# Patient Record
Sex: Male | Born: 1960 | Race: Black or African American | Hispanic: No | Smoking: Never smoker
Health system: Southern US, Community
[De-identification: ages and names within clinical notes are randomized; demographics above are authoritative.]

## PROBLEM LIST (undated history)

## (undated) DIAGNOSIS — M1990 Unspecified osteoarthritis, unspecified site: Secondary | ICD-10-CM

## (undated) DIAGNOSIS — G709 Myoneural disorder, unspecified: Secondary | ICD-10-CM

## (undated) DIAGNOSIS — E785 Hyperlipidemia, unspecified: Secondary | ICD-10-CM

## (undated) DIAGNOSIS — I10 Essential (primary) hypertension: Secondary | ICD-10-CM

## (undated) DIAGNOSIS — J45909 Unspecified asthma, uncomplicated: Secondary | ICD-10-CM

## (undated) DIAGNOSIS — R519 Headache, unspecified: Secondary | ICD-10-CM

## (undated) DIAGNOSIS — G4730 Sleep apnea, unspecified: Secondary | ICD-10-CM

## (undated) HISTORY — PX: KNEE ARTHROPLASTY: SHX992

## (undated) HISTORY — PX: ANKLE SURGERY: SHX546

## (undated) HISTORY — PX: OTHER SURGICAL HISTORY: SHX169

---

## 1999-08-20 ENCOUNTER — Emergency Department (HOSPITAL_COMMUNITY): Admission: EM | Admit: 1999-08-20 | Discharge: 1999-08-20 | Payer: Self-pay

## 2001-03-12 ENCOUNTER — Emergency Department (HOSPITAL_COMMUNITY): Admission: EM | Admit: 2001-03-12 | Discharge: 2001-03-12 | Payer: Self-pay | Admitting: Emergency Medicine

## 2008-11-19 ENCOUNTER — Emergency Department (HOSPITAL_COMMUNITY): Admission: EM | Admit: 2008-11-19 | Discharge: 2008-11-19 | Payer: Self-pay | Admitting: Emergency Medicine

## 2010-03-10 ENCOUNTER — Emergency Department (HOSPITAL_COMMUNITY): Admission: EM | Admit: 2010-03-10 | Discharge: 2010-03-10 | Payer: Self-pay | Admitting: Family Medicine

## 2010-07-21 ENCOUNTER — Ambulatory Visit: Payer: Self-pay | Admitting: Internal Medicine

## 2010-10-20 ENCOUNTER — Ambulatory Visit: Payer: Self-pay | Admitting: Internal Medicine

## 2011-02-20 ENCOUNTER — Encounter (HOSPITAL_BASED_OUTPATIENT_CLINIC_OR_DEPARTMENT_OTHER)
Admission: RE | Admit: 2011-02-20 | Discharge: 2011-02-20 | Disposition: A | Discharge status: Home / Self Care | Payer: Managed Care, Other (non HMO) | Source: Ambulatory Visit | Attending: Orthopaedic Surgery | Admitting: Orthopaedic Surgery

## 2011-02-20 LAB — BASIC METABOLIC PANEL
BUN: 13 mg/dL (ref 6–23)
Creatinine, Ser: 1.01 mg/dL (ref 0.50–1.35)
GFR calc Af Amer: >90 mL/min (ref >90)
GFR calc non Af Amer: 85 mL/min — ABNORMAL LOW (ref >90)

## 2011-02-21 ENCOUNTER — Ambulatory Visit (HOSPITAL_BASED_OUTPATIENT_CLINIC_OR_DEPARTMENT_OTHER)
Admission: RE | Admit: 2011-02-21 | Discharge: 2011-02-21 | Disposition: A | Discharge status: Home / Self Care | Payer: Managed Care, Other (non HMO) | Source: Ambulatory Visit | Attending: Orthopaedic Surgery | Admitting: Orthopaedic Surgery

## 2011-02-21 DIAGNOSIS — M9420 Chondromalacia, unspecified site: Secondary | ICD-10-CM | POA: Insufficient documentation

## 2011-02-21 DIAGNOSIS — M23329 Other meniscus derangements, posterior horn of medial meniscus, unspecified knee: Secondary | ICD-10-CM | POA: Insufficient documentation

## 2011-02-21 DIAGNOSIS — E669 Obesity, unspecified: Secondary | ICD-10-CM | POA: Insufficient documentation

## 2011-02-21 DIAGNOSIS — Z01812 Encounter for preprocedural laboratory examination: Secondary | ICD-10-CM | POA: Insufficient documentation

## 2011-02-21 DIAGNOSIS — Z01810 Encounter for preprocedural cardiovascular examination: Secondary | ICD-10-CM | POA: Insufficient documentation

## 2011-03-16 NOTE — Op Note (Signed)
  NAMENAYSHAWN, MESTA NO.:  [id number]  MEDICAL RECORD NO.:  [id number]  LOCATION:                                 FACILITY:  PHYSICIAN:  Lubertha Basque. Jerl Santos, M.D.     DATE OF BIRTH:  DATE OF PROCEDURE:  02/21/2011 DATE OF DISCHARGE:                              OPERATIVE REPORT   PREOPERATIVE DIAGNOSES: 1. Right knee torn medial meniscus tear. 2. Right knee chondromalacia patella.  POSTOPERATIVE DIAGNOSES: 1. Right knee torn medial meniscus tear. 2. Right knee chondromalacia patella.  PROCEDURES: 1. Right knee partial medial meniscectomy. 2. Abrasion chondroplasty, patellofemoral.  ANESTHESIA:  General.  ATTENDING SURGEON:  Lubertha Basque. Jerl Santos, MD  ASSISTANT:  Lindwood Qua, PA   INDICATIONS FOR PROCEDURE:  The patient is a 50 year old man with several months of right knee pain.  This [place].  He has persisted with difficulty despite oral anti- inflammatories, injection, and rest.  By MRI scan, he has a medial meniscus tear and some chondromalacia patella.  He has pain which limits his ability to rest and walk and he is offered an arthroscopy.  Informed operative consent was obtained after discussion of possible complications including reaction to anesthesia and infection.  SUMMARY/FINDINGS/PROCEDURE:  Under general anesthesia, an arthroscopy of the right knee was performed.  The suprapatellar pouch was benign while the patellofemoral joint exhibited grade 3 and 4 change through the intertrochlear groove with large flaps of articular cartilage which were loose and some emanating loose bodies.  A thorough chondroplasty was done along with abrasion to bleeding bone in some small areas.  The loose bodies were removed.  In the medial compartment, he had a posterior horn medial meniscus tear consistent with his scan and about 10% partial medial meniscectomy was done.  There were no degenerative changes here.  The  ACL appeared intact and the lateral compartment completely benign.  DESCRIPTION OF PROCEDURE:  The patient was taken to the operating suite where general anesthetic was applied without difficulty.  He was positioned supine and prepped and draped in normal sterile fashion. After administration of IV Kefzol, an arthroscopy of the right knee was performed through total of 2 portals.  Findings were as noted above and procedure consisted of the partial medial meniscectomy with basket and shaver back to stable tissues.  This was followed by the abrasion chondroplasty of patellofemoral as outlined above.  The knee was thoroughly irrigated followed by placement of Marcaine with epinephrine and morphine.  Adaptic was placed over the portals followed by dry gauze and loose Ace wrap.  Estimated blood loss and intraoperative fluids can be obtained from anesthesia records.  DISPOSITION:  The patient was extubated in the operating room and taken to the recovery room in stable condition where he is to go home the same- day and follow up in the office closely.  I will contact him by phone tonight.     Lubertha Basque Jerl Santos, M.D.     PGD/MEDQ  D:  02/21/2011  T:  02/21/2011  Job:  [id number]  Electronically Signed by Marcene Corning M.D. on 03/16/2011 01:44:38 PM

## 2012-07-19 ENCOUNTER — Emergency Department (HOSPITAL_COMMUNITY): Payer: BC Managed Care – PPO

## 2012-07-19 ENCOUNTER — Emergency Department (HOSPITAL_COMMUNITY)
Admission: EM | Admit: 2012-07-19 | Discharge: 2012-07-20 | Disposition: A | Discharge status: Home / Self Care | Payer: BC Managed Care – PPO | Attending: Emergency Medicine | Admitting: Emergency Medicine

## 2012-07-19 ENCOUNTER — Encounter (HOSPITAL_COMMUNITY): Payer: Self-pay | Admitting: *Deleted

## 2012-07-19 DIAGNOSIS — G510 Bell's palsy: Secondary | ICD-10-CM

## 2012-07-19 DIAGNOSIS — I10 Essential (primary) hypertension: Secondary | ICD-10-CM | POA: Insufficient documentation

## 2012-07-19 DIAGNOSIS — Z79899 Other long term (current) drug therapy: Secondary | ICD-10-CM | POA: Insufficient documentation

## 2012-07-19 DIAGNOSIS — R209 Unspecified disturbances of skin sensation: Secondary | ICD-10-CM | POA: Insufficient documentation

## 2012-07-19 HISTORY — DX: Essential (primary) hypertension: I10

## 2012-07-19 LAB — CBC
HCT: 44.5 % (ref 39.0–52.0)
Hemoglobin: 16.4 g/dL (ref 13.0–17.0)
MCH: 30 pg (ref 26.0–34.0)
MCHC: 36.9 g/dL — ABNORMAL HIGH (ref 30.0–36.0)

## 2012-07-19 LAB — APTT: aPTT: 37 seconds (ref 24–37)

## 2012-07-19 LAB — DIFFERENTIAL
Basophils Relative: 1 % (ref 0–1)
Lymphs Abs: 4.8 10*3/uL — ABNORMAL HIGH (ref 0.7–4.0)
Monocytes Absolute: 1 10*3/uL (ref 0.1–1.0)
Monocytes Relative: 9 % (ref 3–12)
Neutro Abs: 5.2 10*3/uL (ref 1.7–7.7)

## 2012-07-19 LAB — COMPREHENSIVE METABOLIC PANEL
Albumin: 3.8 g/dL (ref 3.5–5.2)
BUN: 14 mg/dL (ref 6–23)
Chloride: 101 mEq/L (ref 96–112)
Creatinine, Ser: 1.23 mg/dL (ref 0.50–1.35)
GFR calc Af Amer: 77 mL/min — ABNORMAL LOW (ref >90)
GFR calc non Af Amer: 66 mL/min — ABNORMAL LOW (ref >90)
Glucose, Bld: 134 mg/dL — ABNORMAL HIGH (ref 70–99)
Total Bilirubin: 0.2 mg/dL — ABNORMAL LOW (ref 0.3–1.2)

## 2012-07-19 LAB — GLUCOSE, CAPILLARY: Glucose-Capillary: 126 mg/dL — ABNORMAL HIGH (ref 70–99)

## 2012-07-19 LAB — TROPONIN I: Troponin I: <0.3 ng/mL (ref <0.30)

## 2012-07-19 MED ORDER — PREDNISONE 50 MG PO TABS: 50.0000 mg | ORAL_TABLET | Freq: Every day | ORAL | Status: DC

## 2012-07-19 MED ORDER — ACYCLOVIR 400 MG PO TABS: 400.0000 mg | ORAL_TABLET | Freq: Four times a day (QID) | ORAL | Status: DC

## 2012-07-19 MED ORDER — ARTIFICIAL TEARS OP OINT: TOPICAL_OINTMENT | OPHTHALMIC | Status: DC | PRN

## 2012-07-19 NOTE — ED Provider Notes (Signed)
MSE was initiated and I personally evaluated the patient and placed orders (if any) at  10:43 PM on July 19, 2012.  The patient appears stable so that the remainder of the MSE may be completed by another provider.  Symptoms of right sided facial droop began at noon today.  Exam most c/w Bell's palsy.  Pt is hypertensive on vitals.    Ethelda Chick, MD 07/19/12 [phone]

## 2012-07-19 NOTE — ED Notes (Signed)
Patient transported back from CT 

## 2012-07-19 NOTE — ED Provider Notes (Addendum)
History  This chart was scribed for Loren Racer, MD by Bennett Scrape, ED Scribe. This patient was seen in room WA13/WA13 and the patient's care was started at 11:03 PM.  CSN: [id number]  Arrival date & time 07/19/12  2234   First MD Initiated Contact with Patient 07/19/12 2303      Chief Complaint  Patient presents with  . Facial Droop     The history is provided by the patient. No language interpreter was used.   Peter Zamora is a 52 y.o. male who presents to the Emergency Department complaining of approximately 11 hours of sudden onset, non-changing, constant right sided facial droop with associated numbness along the right jaw that he noticed at work today. He reports that he noticed the numbness while eating and went home to take a shower when he noticed the facial droop. He states that he wanted to "wait the symptoms out" but became concerned when they did not improve after several hours. He denies having prior episodes of similar symptoms. He denies drooling, trouble speaking and extremity weakness and numbness as associated symptoms. He has a h/o HTN for which he takes daily medications for.   Past Medical History  Diagnosis Date  . Hypertension     History reviewed. No pertinent past surgical history.  History reviewed. No pertinent family history.  History  Substance Use Topics  . Smoking status: Not on file  . Smokeless tobacco: Not on file  . Alcohol Use: Not on file      Review of Systems  Constitutional: Negative for fever and chills.  HENT: Negative for drooling and trouble swallowing.   Gastrointestinal: Negative for nausea and vomiting.  Neurological: Positive for facial asymmetry. Negative for speech difficulty.  All other systems reviewed and are negative.    Allergies  Review of patient's allergies indicates no known allergies.  Home Medications   Current Outpatient Rx  Name  Route  Sig  Dispense  Refill  . etodolac (LODINE) 400 MG  tablet   Oral   Take 400 mg by mouth 2 (two) times daily.         Marland Kitchen losartan-hydrochlorothiazide (HYZAAR) 100-25 MG per tablet   Oral   Take 1 tablet by mouth daily.         . metroNIDAZOLE (FLAGYL) 500 MG tablet   Oral   Take 500 mg by mouth 3 (three) times daily. For 7 days only. Stop date:07/20/12         . acyclovir (ZOVIRAX) 400 MG tablet   Oral   Take 1 tablet (400 mg total) by mouth 4 (four) times daily.   40 tablet   0   . artificial tears (LACRILUBE) OINT ophthalmic ointment   Ophthalmic   Apply to eye every 4 (four) hours as needed.   1 Tube   0   . predniSONE (DELTASONE) 50 MG tablet   Oral   Take 1 tablet (50 mg total) by mouth daily.   5 tablet   0     Triage Vitals: BP 180/101  Pulse 85  Temp(Src) 98.3 F (36.8 C) (Oral)  Resp 16  Ht 6' (1.829 m)  Wt 243 lb (110.224 kg)  BMI 32.95 kg/m2  SpO2 92%  Physical Exam  Nursing note and vitals reviewed. Constitutional: He is oriented to person, place, and time. He appears well-developed and well-nourished. No distress.  HENT:  Head: Normocephalic and atraumatic.  Eyes: Conjunctivae and EOM are normal. Pupils are equal, round, and  reactive to light.  Neck: Neck supple. No tracheal deviation present.  Cardiovascular: Normal rate and regular rhythm.   Pulmonary/Chest: Effort normal and breath sounds normal. No respiratory distress.  Abdominal: Soft. There is no tenderness.  Musculoskeletal: Normal range of motion.  Neurological: He is alert and oriented to person, place, and time.  Right frontalis muscle weakness, right sided facial droop, normal grip strength, 5/5 strength in the lower extremities, normal finger to nose, sensation is intact throughout  Skin: Skin is warm and dry.  Psychiatric: He has a normal mood and affect. His behavior is normal.    ED Course  Procedures (including critical care time)  DIAGNOSTIC STUDIES: Oxygen Saturation is 92% on room air, adequate by my interpretation.     COORDINATION OF CARE: 11:10 PM- Informed pt that I believe that his symptoms represent Bell's Palsy and advised pt that the symptoms are temporary and should improve in a few days. Will provide a neurology referral for pt to f/u. Stated that I would be back to discuss further treatment plan once all of the lab and radiology reports come back. Pt is agreeable to this plan.  11:51 PM -Informed pt of radiology and lab work results. Discussed discharge plan with pt and pt agreed to plan. Also advised pt to follow up with neurology as needed and pt agreed.  Labs Reviewed  CBC - Abnormal; Notable for the following:    WBC 11.4 (*)    MCHC 36.9 (*)    All other components within normal limits  DIFFERENTIAL - Abnormal; Notable for the following:    Lymphs Abs 4.8 (*)    All other components within normal limits  COMPREHENSIVE METABOLIC PANEL - Abnormal; Notable for the following:    Potassium 3.4 (*)    Glucose, Bld 134 (*)    Total Bilirubin 0.2 (*)    GFR calc non Af Amer 66 (*)    GFR calc Af Amer 77 (*)    All other components within normal limits  GLUCOSE, CAPILLARY - Abnormal; Notable for the following:    Glucose-Capillary 126 (*)    All other components within normal limits  PROTIME-INR  APTT  TROPONIN I   Ct Head (brain) Wo Contrast  07/19/2012  *RADIOLOGY REPORT*  Clinical Data: 52 year old male with right facial droop. Hypertensive.  CT HEAD WITHOUT CONTRAST  Technique:  Contiguous axial images were obtained from the base of the skull through the vertex without contrast.  Comparison: None.  Findings: Visualized paranasal sinuses and mastoids are clear.  No acute osseous abnormality identified.  Visualized orbit soft tissues are within normal limits.  Visualized scalp soft tissues are within normal limits.  Mild calcified atherosclerosis at the skull base.  Cerebral volume is within normal limits for age.  No midline shift, ventriculomegaly, mass effect, evidence of mass lesion,  intracranial hemorrhage or evidence of cortically based acute infarction.  Gray-white matter differentiation is within normal limits throughout the brain.  No suspicious intracranial vascular hyperdensity.  IMPRESSION: Normal noncontrast CT appearance of the brain.   Original Report Authenticated By: Erskine Speed, M.D.      1. Bell's palsy       MDM  I personally performed the services described in this documentation, which was scribed in my presence. The recorded information has been reviewed and is accurate.      Loren Racer, MD 08/04/12 1742  Loren Racer, MD 08/04/12 [phone]

## 2012-07-19 NOTE — ED Notes (Signed)
Patient is alert and oriented x3.  He states he was at work today and started having right facial droop. He waiting till he go home to see if it would go away and it did not.  His wife drove him to ED to be seen

## 2012-07-19 NOTE — ED Notes (Signed)
Patient transported to CT 

## 2012-07-21 ENCOUNTER — Telehealth (HOSPITAL_COMMUNITY): Payer: Self-pay | Admitting: Emergency Medicine

## 2013-06-28 IMAGING — CT CT HEAD W/O CM
2 series · 17 of 30 positions shown, 20 images · non-contrast
Comparison: None.

CLINICAL DATA: 51-year-old male with right facial droop.
Hypertensive.

CT HEAD WITHOUT CONTRAST
TECHNIQUE: Contiguous axial images were obtained from the base of
the skull through the vertex without contrast.

[Series 2: head w/o · axial · non-contrast · 0.43mm/px · z∈[+1175,+1290]mm · 9 of 29 slices shown, 12 images]
[im 3/29  brain]
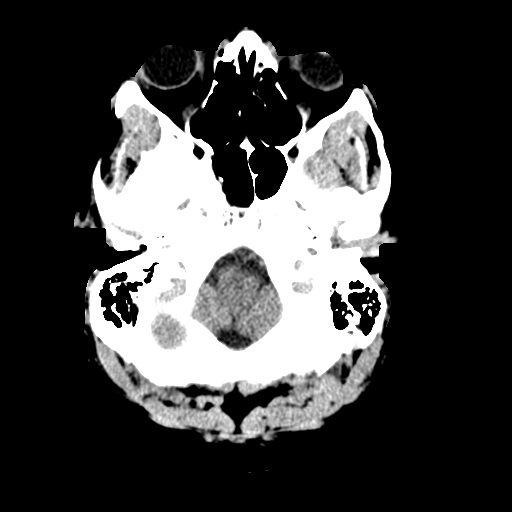
[im 3/29  bone]
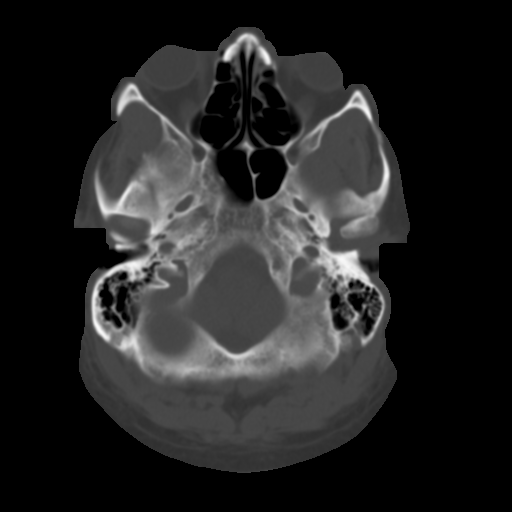
[im 6/29  brain]
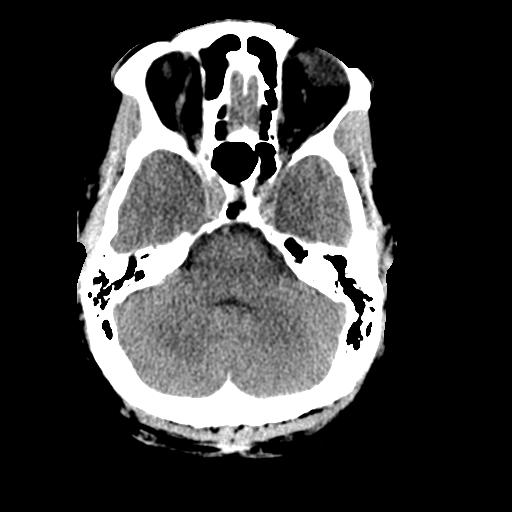
[im 9/29  brain]
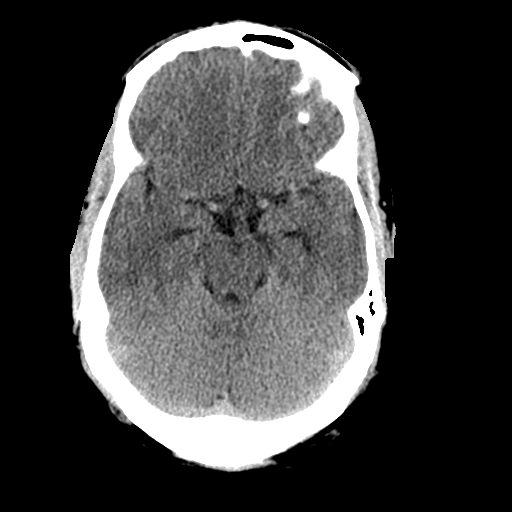
[im 12/29  brain]
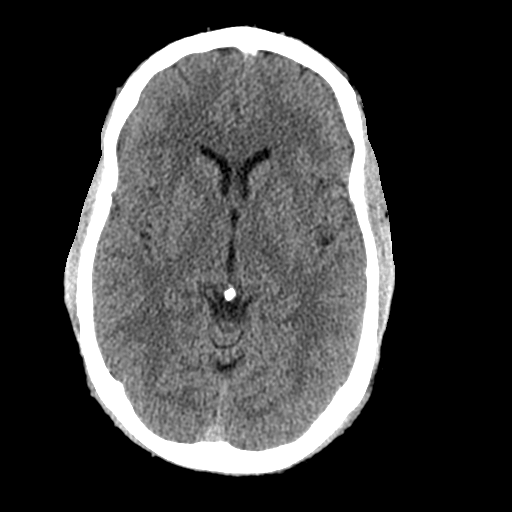
[im 15/29  brain]
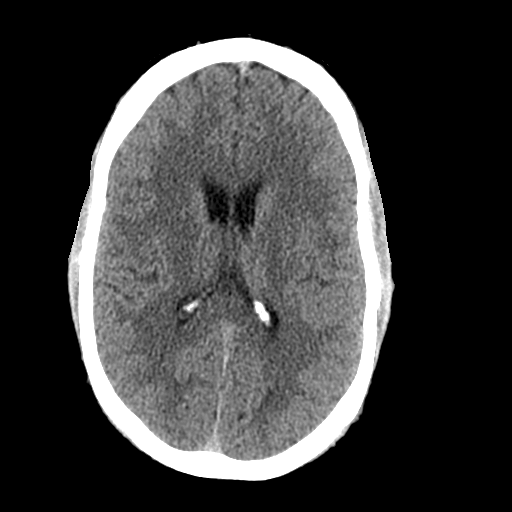
[im 15/29  bone]
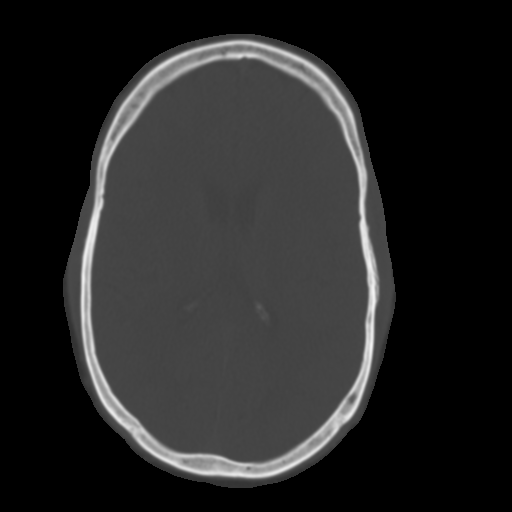
[im 17/29  brain]
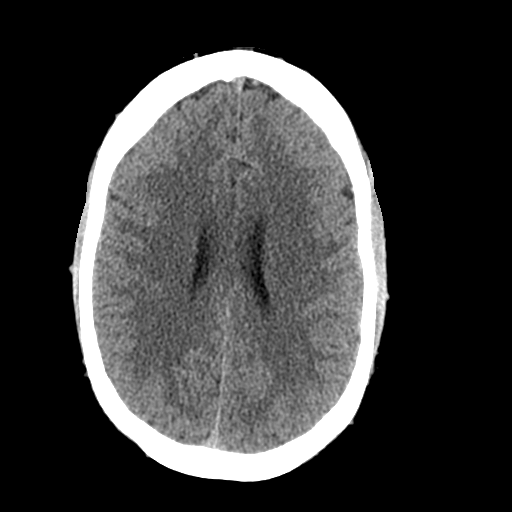
[im 20/29  brain]
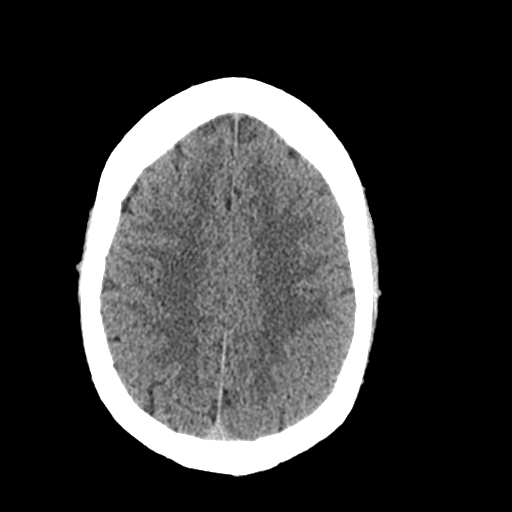
[im 23/29  brain]
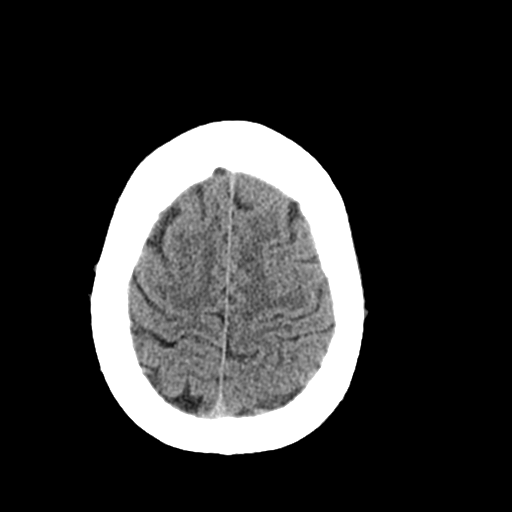
[im 26/29  brain]
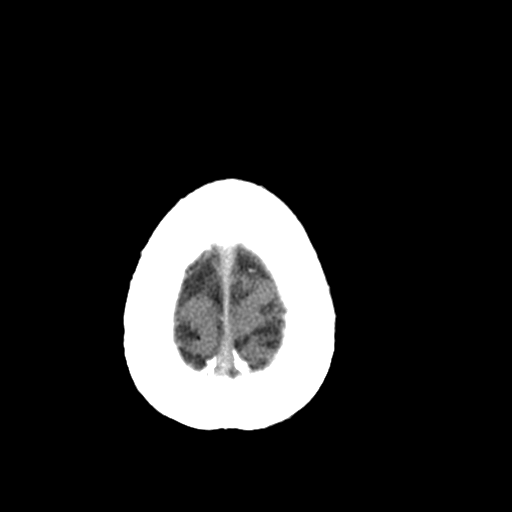
[im 26/29  bone]
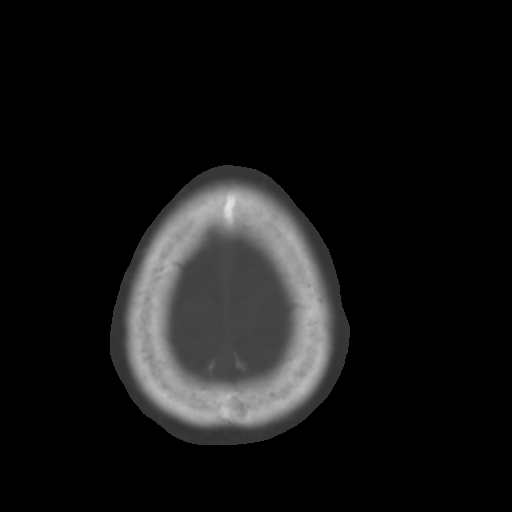

[Series 3: bone windows · axial · 0.43mm/px · z∈[+1180,+1291]mm · 8 of 49 slices shown]
[im 6/49  bone]
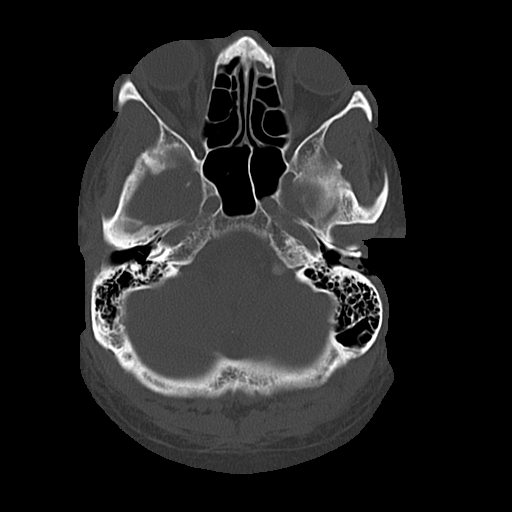
[im 11/49  bone]
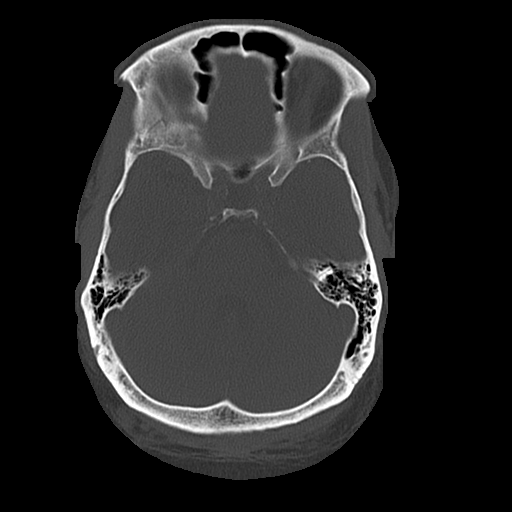
[im 17/49  bone]
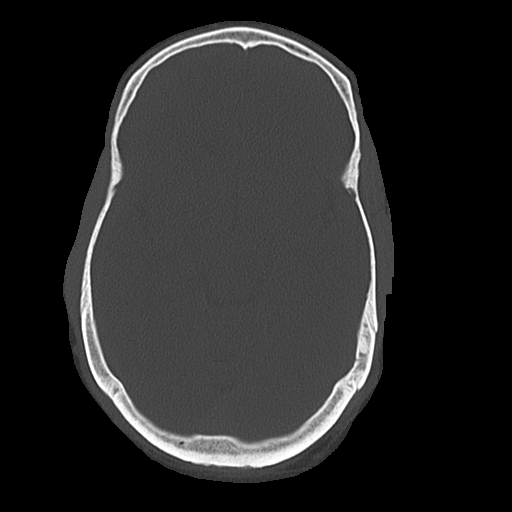
[im 22/49  bone]
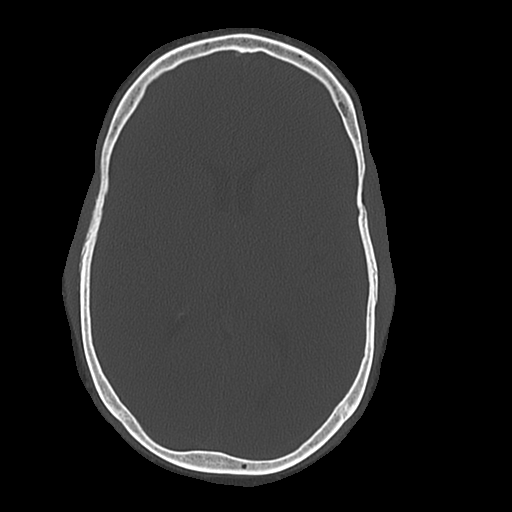
[im 27/49  bone]
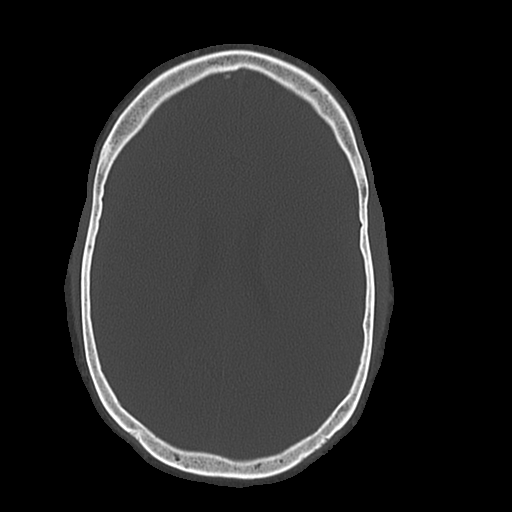
[im 33/49  bone]
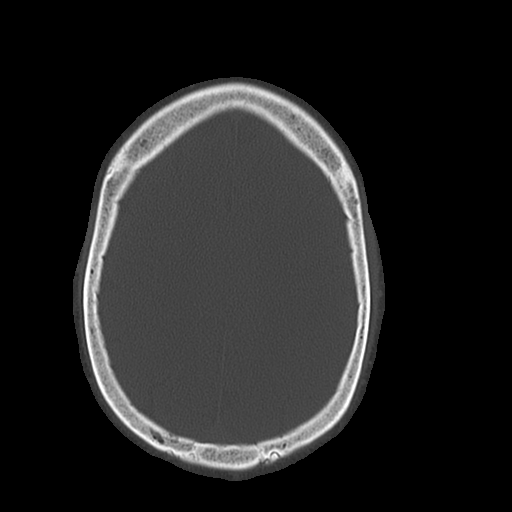
[im 38/49  bone]
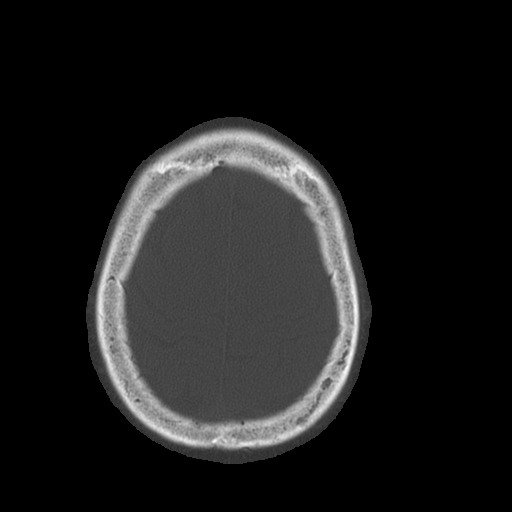
[im 43/49  bone]
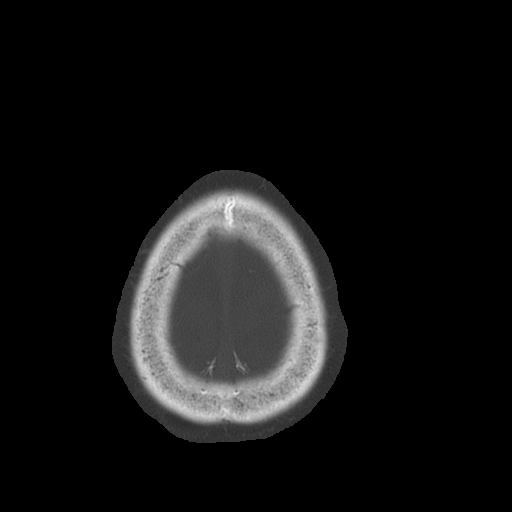

[17 of 30 positions shown; findings below may reference images not displayed]

FINDINGS: Visualized paranasal sinuses and mastoids are clear.  No
acute osseous abnormality identified.  Visualized orbit soft
tissues are within normal limits.  Visualized scalp soft tissues
are within normal limits.

Mild calcified atherosclerosis at the skull base.  Cerebral volume
is within normal limits for age.  No midline shift,
ventriculomegaly, mass effect, evidence of mass lesion,
intracranial hemorrhage or evidence of cortically based acute
infarction.  Gray-white matter differentiation is within normal
limits throughout the brain.  No suspicious intracranial vascular
hyperdensity.
IMPRESSION: Normal noncontrast CT appearance of the brain.

## 2018-01-20 DIAGNOSIS — J449 Chronic obstructive pulmonary disease, unspecified: Secondary | ICD-10-CM

## 2018-01-20 HISTORY — DX: Chronic obstructive pulmonary disease, unspecified: J44.9

## 2020-07-06 HISTORY — PX: HERNIA REPAIR: SHX51

## 2021-05-17 ENCOUNTER — Other Ambulatory Visit: Payer: Self-pay

## 2021-05-17 ENCOUNTER — Inpatient Hospital Stay (HOSPITAL_COMMUNITY)
Admission: EM | Admit: 2021-05-17 | Discharge: 2021-05-19 | DRG: 638 | Disposition: A | Discharge status: Home / Self Care | Payer: No Typology Code available for payment source | Attending: Internal Medicine | Admitting: Internal Medicine

## 2021-05-17 ENCOUNTER — Encounter (HOSPITAL_COMMUNITY): Payer: Self-pay

## 2021-05-17 DIAGNOSIS — Z79899 Other long term (current) drug therapy: Secondary | ICD-10-CM

## 2021-05-17 DIAGNOSIS — E1110 Type 2 diabetes mellitus with ketoacidosis without coma: Secondary | ICD-10-CM | POA: Diagnosis not present

## 2021-05-17 DIAGNOSIS — E669 Obesity, unspecified: Secondary | ICD-10-CM | POA: Diagnosis present

## 2021-05-17 DIAGNOSIS — Z96652 Presence of left artificial knee joint: Secondary | ICD-10-CM | POA: Diagnosis present

## 2021-05-17 DIAGNOSIS — Z7982 Long term (current) use of aspirin: Secondary | ICD-10-CM

## 2021-05-17 DIAGNOSIS — E66811 Obesity, class 1: Secondary | ICD-10-CM | POA: Diagnosis present

## 2021-05-17 DIAGNOSIS — J449 Chronic obstructive pulmonary disease, unspecified: Secondary | ICD-10-CM | POA: Diagnosis present

## 2021-05-17 DIAGNOSIS — R7989 Other specified abnormal findings of blood chemistry: Secondary | ICD-10-CM | POA: Diagnosis present

## 2021-05-17 DIAGNOSIS — E860 Dehydration: Secondary | ICD-10-CM | POA: Diagnosis present

## 2021-05-17 DIAGNOSIS — E876 Hypokalemia: Secondary | ICD-10-CM | POA: Diagnosis present

## 2021-05-17 DIAGNOSIS — E785 Hyperlipidemia, unspecified: Secondary | ICD-10-CM | POA: Diagnosis present

## 2021-05-17 DIAGNOSIS — Z20822 Contact with and (suspected) exposure to covid-19: Secondary | ICD-10-CM | POA: Diagnosis present

## 2021-05-17 DIAGNOSIS — G629 Polyneuropathy, unspecified: Secondary | ICD-10-CM

## 2021-05-17 DIAGNOSIS — F39 Unspecified mood [affective] disorder: Secondary | ICD-10-CM | POA: Diagnosis present

## 2021-05-17 DIAGNOSIS — I10 Essential (primary) hypertension: Secondary | ICD-10-CM | POA: Diagnosis present

## 2021-05-17 DIAGNOSIS — D72829 Elevated white blood cell count, unspecified: Secondary | ICD-10-CM | POA: Diagnosis present

## 2021-05-17 DIAGNOSIS — Z7952 Long term (current) use of systemic steroids: Secondary | ICD-10-CM

## 2021-05-17 DIAGNOSIS — Z8249 Family history of ischemic heart disease and other diseases of the circulatory system: Secondary | ICD-10-CM

## 2021-05-17 DIAGNOSIS — F439 Reaction to severe stress, unspecified: Secondary | ICD-10-CM | POA: Diagnosis present

## 2021-05-17 DIAGNOSIS — Z6831 Body mass index (BMI) 31.0-31.9, adult: Secondary | ICD-10-CM

## 2021-05-17 DIAGNOSIS — N179 Acute kidney failure, unspecified: Secondary | ICD-10-CM | POA: Diagnosis present

## 2021-05-17 DIAGNOSIS — R17 Unspecified jaundice: Secondary | ICD-10-CM | POA: Diagnosis present

## 2021-05-17 DIAGNOSIS — Z833 Family history of diabetes mellitus: Secondary | ICD-10-CM

## 2021-05-17 DIAGNOSIS — E119 Type 2 diabetes mellitus without complications: Secondary | ICD-10-CM

## 2021-05-17 DIAGNOSIS — M79674 Pain in right toe(s): Secondary | ICD-10-CM | POA: Diagnosis present

## 2021-05-17 DIAGNOSIS — E1142 Type 2 diabetes mellitus with diabetic polyneuropathy: Secondary | ICD-10-CM | POA: Diagnosis present

## 2021-05-17 HISTORY — DX: Hyperlipidemia, unspecified: E78.5

## 2021-05-17 LAB — GLUCOSE, CAPILLARY
Glucose-Capillary: 567 mg/dL (ref 70–99)
Glucose-Capillary: 397 mg/dL — ABNORMAL HIGH (ref 70–99)
Glucose-Capillary: 418 mg/dL — ABNORMAL HIGH (ref 70–99)
Glucose-Capillary: 287 mg/dL — ABNORMAL HIGH (ref 70–99)
Glucose-Capillary: 251 mg/dL — ABNORMAL HIGH (ref 70–99)
Glucose-Capillary: 208 mg/dL — ABNORMAL HIGH (ref 70–99)
Glucose-Capillary: 215 mg/dL — ABNORMAL HIGH (ref 70–99)
Glucose-Capillary: 216 mg/dL — ABNORMAL HIGH (ref 70–99)
Glucose-Capillary: 293 mg/dL — ABNORMAL HIGH (ref 70–99)

## 2021-05-17 LAB — COMPREHENSIVE METABOLIC PANEL
ALT: 27 U/L (ref 0–44)
AST: 16 U/L (ref 15–41)
Albumin: 4.2 g/dL (ref 3.5–5.0)
Alkaline Phosphatase: 168 U/L — ABNORMAL HIGH (ref 38–126)
Anion gap: >20 — ABNORMAL HIGH (ref 5–15)
BUN: 33 mg/dL — ABNORMAL HIGH (ref 6–20)
CO2: 17 mmol/L — ABNORMAL LOW (ref 22–32)
Calcium: 9.2 mg/dL (ref 8.9–10.3)
Chloride: 84 mmol/L — ABNORMAL LOW (ref 98–111)
Creatinine, Ser: 2.06 mg/dL — ABNORMAL HIGH (ref 0.61–1.24)
GFR, Estimated: 36 mL/min — ABNORMAL LOW (ref >60)
Glucose, Bld: 787 mg/dL (ref 70–99)
Potassium: 4.7 mmol/L (ref 3.5–5.1)
Sodium: 123 mmol/L — ABNORMAL LOW (ref 135–145)
Total Bilirubin: 1.5 mg/dL — ABNORMAL HIGH (ref 0.3–1.2)
Total Protein: 8.3 g/dL — ABNORMAL HIGH (ref 6.5–8.1)

## 2021-05-17 LAB — URINALYSIS, ROUTINE W REFLEX MICROSCOPIC
Bacteria, UA: NONE SEEN
Bilirubin Urine: NEGATIVE
Glucose, UA: >=500 mg/dL — AB
Ketones, ur: 20 mg/dL — AB
Leukocytes,Ua: NEGATIVE
Nitrite: NEGATIVE
Protein, ur: NEGATIVE mg/dL
Specific Gravity, Urine: 1.023 (ref 1.005–1.030)
pH: 5 (ref 5.0–8.0)

## 2021-05-17 LAB — CBC
HCT: 43.8 % (ref 39.0–52.0)
Hemoglobin: 15.7 g/dL (ref 13.0–17.0)
MCH: 29.8 pg (ref 26.0–34.0)
MCHC: 35.8 g/dL (ref 30.0–36.0)
MCV: 83.1 fL (ref 80.0–100.0)
Platelets: 191 10*3/uL (ref 150–400)
RBC: 5.27 MIL/uL (ref 4.22–5.81)
RDW: 12 % (ref 11.5–15.5)
WBC: 12.7 10*3/uL — ABNORMAL HIGH (ref 4.0–10.5)
nRBC: 0 % (ref 0.0–0.2)

## 2021-05-17 LAB — BLOOD GAS, VENOUS
Acid-base deficit: 8.5 mmol/L — ABNORMAL HIGH (ref 0.0–2.0)
Bicarbonate: 18 mmol/L — ABNORMAL LOW (ref 20.0–28.0)
O2 Saturation: 32.4 %
Patient temperature: 98.6
pCO2, Ven: 41.8 mmHg — ABNORMAL LOW (ref 44.0–60.0)
pH, Ven: 7.257 (ref 7.250–7.430)
pO2, Ven: <31 mmHg — CL (ref 32.0–45.0)

## 2021-05-17 LAB — BASIC METABOLIC PANEL WITH GFR
Anion gap: 18 — ABNORMAL HIGH (ref 5–15)
BUN: 32 mg/dL — ABNORMAL HIGH (ref 6–20)
CO2: 18 mmol/L — ABNORMAL LOW (ref 22–32)
Calcium: 9.4 mg/dL (ref 8.9–10.3)
Chloride: 94 mmol/L — ABNORMAL LOW (ref 98–111)
Creatinine, Ser: 1.79 mg/dL — ABNORMAL HIGH (ref 0.61–1.24)
GFR, Estimated: 43 mL/min — ABNORMAL LOW
Glucose, Bld: 402 mg/dL — ABNORMAL HIGH (ref 70–99)
Potassium: 4.3 mmol/L (ref 3.5–5.1)
Sodium: 130 mmol/L — ABNORMAL LOW (ref 135–145)

## 2021-05-17 LAB — BETA-HYDROXYBUTYRIC ACID
Beta-Hydroxybutyric Acid: 7.59 mmol/L — ABNORMAL HIGH (ref 0.05–0.27)
Beta-Hydroxybutyric Acid: 5.82 mmol/L — ABNORMAL HIGH (ref 0.05–0.27)

## 2021-05-17 LAB — RESP PANEL BY RT-PCR (FLU A&B, COVID) ARPGX2
Influenza A by PCR: NEGATIVE
Influenza B by PCR: NEGATIVE
SARS Coronavirus 2 by RT PCR: NEGATIVE

## 2021-05-17 LAB — CBG MONITORING, ED: Glucose-Capillary: >600 mg/dL (ref 70–99)

## 2021-05-17 LAB — TSH: TSH: 1.545 u[IU]/mL (ref 0.350–4.500)

## 2021-05-17 LAB — MAGNESIUM: Magnesium: 2.4 mg/dL (ref 1.7–2.4)

## 2021-05-17 LAB — MRSA NEXT GEN BY PCR, NASAL: MRSA by PCR Next Gen: NOT DETECTED

## 2021-05-17 MED ORDER — LACTATED RINGERS IV SOLN: INTRAVENOUS | Status: DC

## 2021-05-17 MED ORDER — DEXTROSE IN LACTATED RINGERS 5 % IV SOLN: INTRAVENOUS | Status: DC

## 2021-05-17 MED ORDER — ACETAMINOPHEN 650 MG RE SUPP: 650.0000 mg | Freq: Four times a day (QID) | RECTAL | Status: DC | PRN

## 2021-05-17 MED ORDER — INSULIN REGULAR(HUMAN) IN NACL 100-0.9 UT/100ML-% IV SOLN: INTRAVENOUS | Status: DC

## 2021-05-17 MED ORDER — ACETAMINOPHEN 325 MG PO TABS: 650.0000 mg | ORAL_TABLET | Freq: Four times a day (QID) | ORAL | Status: DC | PRN

## 2021-05-17 MED ORDER — HYDROMORPHONE HCL 1 MG/ML IJ SOLN: 1.0000 mg | Freq: Once | INTRAMUSCULAR | Status: DC

## 2021-05-17 MED ORDER — ONDANSETRON HCL 4 MG/2ML IJ SOLN
4.0000 mg | Freq: Once | INTRAMUSCULAR | Status: AC
  Administered 2021-05-17: 15:00:00 4 mg via INTRAVENOUS
  Filled 2021-05-17: qty 2

## 2021-05-17 MED ORDER — LACTATED RINGERS IV BOLUS
10.0000 mL/kg | Freq: Once | INTRAVENOUS | Status: AC
  Administered 2021-05-17: 15:00:00 1021 mL via INTRAVENOUS

## 2021-05-17 MED ORDER — DEXTROSE 50 % IV SOLN: 0.0000 mL | INTRAVENOUS | Status: DC | PRN

## 2021-05-17 MED ORDER — POTASSIUM CHLORIDE 10 MEQ/100ML IV SOLN
10.0000 meq | INTRAVENOUS | Status: AC
  Administered 2021-05-17 (×2): 10 meq via INTRAVENOUS
  Filled 2021-05-17 (×2): qty 100

## 2021-05-17 MED ORDER — INSULIN REGULAR(HUMAN) IN NACL 100-0.9 UT/100ML-% IV SOLN
INTRAVENOUS | Status: DC
  Administered 2021-05-17: 16:00:00 13 [IU]/h via INTRAVENOUS
  Administered 2021-05-18: 09:00:00 2.4 [IU]/h via INTRAVENOUS
  Filled 2021-05-17 (×2): qty 100

## 2021-05-17 MED ORDER — CHLORHEXIDINE GLUCONATE CLOTH 2 % EX PADS
6.0000 | MEDICATED_PAD | Freq: Every day | CUTANEOUS | Status: DC
  Administered 2021-05-17: 18:00:00 6 via TOPICAL

## 2021-05-17 MED ORDER — LACTATED RINGERS IV BOLUS
1000.0000 mL | Freq: Once | INTRAVENOUS | Status: AC
  Administered 2021-05-17: 18:00:00 1000 mL via INTRAVENOUS

## 2021-05-17 MED ORDER — ENOXAPARIN SODIUM 60 MG/0.6ML IJ SOSY
50.0000 mg | PREFILLED_SYRINGE | INTRAMUSCULAR | Status: DC
  Administered 2021-05-17 – 2021-05-18 (×2): 50 mg via SUBCUTANEOUS
  Filled 2021-05-17: qty 0.5
  Filled 2021-05-17: qty 0.6
  Filled 2021-05-17: qty 0.5

## 2021-05-17 MED ORDER — METOPROLOL TARTRATE 5 MG/5ML IV SOLN
5.0000 mg | Freq: Three times a day (TID) | INTRAVENOUS | Status: DC
  Administered 2021-05-17 – 2021-05-19 (×5): 5 mg via INTRAVENOUS
  Filled 2021-05-17 (×5): qty 5

## 2021-05-17 MED ORDER — PANTOPRAZOLE SODIUM 40 MG IV SOLR
40.0000 mg | INTRAVENOUS | Status: DC
  Administered 2021-05-17 – 2021-05-18 (×2): 40 mg via INTRAVENOUS
  Filled 2021-05-17 (×2): qty 40

## 2021-05-17 MED ORDER — ONDANSETRON HCL 4 MG/2ML IJ SOLN: 4.0000 mg | Freq: Four times a day (QID) | INTRAMUSCULAR | Status: DC | PRN

## 2021-05-17 MED ORDER — LACTATED RINGERS IV BOLUS
1000.0000 mL | Freq: Once | INTRAVENOUS | Status: AC
Start: 2021-05-17 — End: 2021-05-17
  Administered 2021-05-17: 15:00:00 1000 mL via INTRAVENOUS

## 2021-05-17 MED ORDER — ONDANSETRON HCL 4 MG PO TABS: 4.0000 mg | ORAL_TABLET | Freq: Four times a day (QID) | ORAL | Status: DC | PRN

## 2021-05-17 NOTE — ED Notes (Signed)
PCO2 <31.0. Received from lab.

## 2021-05-17 NOTE — ED Provider Notes (Signed)
Emergency Medicine Provider Triage Evaluation Note  Peter Zamora , a 60 y.o. male  was evaluated in triage.  Pt complains of fatigue for 4 weeks. States nausea and fatigue and decreased appetite. Was seen at [facility] today and told his sodium was low and kidney function had worsened. Has med list w NSAID and ARB.   Review of Systems  Positive: Fatigue  Negative: Fever   Physical Exam  BP (!) 130/91 (BP Location: Left Arm)    Pulse 93    Temp 98.7 F (37.1 C) (Oral)    Resp 16    SpO2 97%  Gen:   Awake, no distress   Resp:  Normal effort MSK:   Moves extremities without difficulty  Other:    Medical Decision Making  Medically screening exam initiated at 1:41 PM.  Appropriate orders placed.  Peter Zamora was informed that the remainder of the evaluation will be completed by another provider, this initial triage assessment does not replace that evaluation, and the importance of remaining in the ED until their evaluation is complete.  Labs ordered. Seems kidney function abn and low sodium.  Non-alcoholic per patient. And no history of kidney disease.   Solon Augusta Lockwood, [place] 05/17/21 1343    Pricilla Loveless, MD 05/17/21 [phone]

## 2021-05-17 NOTE — ED Notes (Signed)
Pt. Blood work was unsuccessful. Nurse aware.

## 2021-05-17 NOTE — H&P (Signed)
History and Physical    Peter Zamora TXM:[id number] DOB: 15-Nov-1960 DOA: 05/17/2021  PCP: Pcp, No  Patient coming from: Home.  I have personally briefly reviewed patient's old medical records in [facility]  Chief Complaint: Abnormal labs.  HPI: Peter Zamora is a 60 y.o. male with medical history significant of COPD, hyperlipidemia, hypertension, prediabetes, class I obesity who is coming to the emergency department after being called by the [facility] clinic asking him to come to the emergency department due to abnormal labs.  He has had over 40 pounds of weight loss in the last 4 weeks.  He has blurred vision, polyuria and polydipsia.  He has felt lightheaded and tired.  No fever, chills, rhinorrhea, sore throat, wheezing, dyspnea or hemoptysis.  No chest pain, palpitations, diaphoresis, PND, orthopnea or pitting edema of the lower extremities.  He has felt nauseous, but no abdominal pain, emesis, diarrhea, constipation, melena or hematochezia.  No flank pain, dysuria, oliguria or hematuria.  ED Course: Initial vital signs were temperature 98.7 F, pulse 93, respiration 16, BP 130/91 mmHg O2 sat 91% on room air.  The patient received LR 2000 mL bolus, ondansetron 4 mg IVPB, KCl 10 mEq x 2 IVPB and was started on an insulin infusion.  Lab work: Urinalysis showed glucosuria more than 500 and ketonuria 20 mg/dL.  There was small hemoglobinuria coronavirus and influenza PCR was negative.  CBC showed a white count of 12.7, hemoglobin of 15.7 g/dL and platelets 482.  BHA was 7.59 mmol/L.  Venous blood gas showed normal pH, PCO2 was 42 and PO2 less than 31 mmHg.  Bicarbonate was 18.0 and acid-base deficit 8.5 mmol/L.  TSH and magnesium were normal.  Sodium was 123, potassium 4.7, chloride 84 and CO2 17 mmol/L with an anion gap of more than 20.  Bilirubin was 1.5, glucose found 187, BUN 33 and creatinine 2.06 mg/dL.  According to the patient he does not have any renal disease.  Total protein was 8.3  mg/dL.  Review of Systems: As per HPI otherwise all other systems reviewed and are negative.  Past Medical History:  Diagnosis Date   Chronic obstructive pulmonary disease (HCC) 01/20/2018   Hyperlipidemia    Hypertension     Past Surgical History:  Procedure Laterality Date   ANKLE SURGERY Right    HERNIA REPAIR  07/06/2020   umbilical hernia repair, done by [facility] in [place]   tka Left     Social History  reports that he has never smoked. He does not have any smokeless tobacco history on file. He reports that he does not currently use alcohol. No history on file for drug use.  Allergies  Allergen Reactions   Diclofenac     Other reaction(s): Itching of eye   Latex Rash and Hives   Melatonin Rash    Family History  Problem Relation Age of Onset   Diabetes Mother    Heart attack Father    Prior to Admission medications   Medication Sig Start Date End Date Taking? Authorizing Provider  albuterol (VENTOLIN HFA) 108 (90 Base) MCG/ACT inhaler INHALE 1 PUFF BY MOUTH FOUR TIMES A DAY AS NEEDED FOR SHORTNESS OF BREATH 09/05/19  Yes [provider]  aspirin 81 MG EC tablet TAKE ONE TABLET BY MOUTH DAILY FOR POOR CIRCULATION 05/17/21  Yes [provider]  Carboxymethylcellulose Sodium 0.25 % SOLN INSTILL 1 DROP IN [facility] EYE 2-4 TIMES DAILY 11/04/20  Yes [provider]  cetirizine (ZYRTEC) 10 MG  tablet Take 1 tablet by mouth daily. 02/01/21  Yes [provider]  chlorthalidone (HYGROTON) 25 MG tablet TAKE ONE-HALF TABLET BY MOUTH EVERY MORNING FOR BLOOD PRESSURE 02/01/21  Yes [provider]  DULoxetine (CYMBALTA) 30 MG capsule Take 1 capsule by mouth 2 (two) times daily. 04/12/21  Yes [provider]  Eyelid Cleansers (AVENOVA) 0.01 % SOLN APPLY 1 SPRAY TO AFFECTED AREA TWICE A DAY AS DIRECTED 05/19/20  Yes [provider]  finasteride (PROSCAR) 5 MG tablet Take 1 tablet by mouth daily. 11/19/20  Yes [provider]   fluticasone (FLONASE) 50 MCG/ACT nasal spray INSTILL 1 SPRAY IN EACH NOSTRIL AT BEDTIME - USE NIGHTLY FOR 2 WEEKS, THEN NIGHTLY IF NEEDED. 02/01/21  Yes [provider]  gabapentin (NEURONTIN) 300 MG capsule TAKE ONE CAPSULE BY MOUTH IN THE MORNING AND EVENING AND TAKE TWO CAPSULES AT BEDTIME 10/05/20  Yes [provider]  guaifenesin (HUMIBID E) 400 MG TABS tablet TAKE ONE TABLET BY MOUTH EVERY 6 HOURS AS NEEDED FOR COUGH 10/22/19  Yes [provider]  lidocaine (LIDODERM) 5 % APPLY 1 PATCH TO SKIN ONCE DAILY (APPLY FOR 12 HOURS, THEN REMOVE FOR 12 HOURS) 02/01/21  Yes [provider]  losartan (COZAAR) 100 MG tablet TAKE ONE TABLET BY MOUTH EVERY DAY FOR HEART 02/01/21  Yes [provider]  lovastatin (MEVACOR) 20 MG tablet TAKE ONE TABLET BY MOUTH ONCE A DAY FOR CHOLESTEROL 02/01/21  Yes [provider]  Magnesium Oxide 420 MG TABS TAKE ONE TABLET BY MOUTH DAILY TAKE TO TREAT LOW MAGNESIUM 02/01/21  Yes [provider]  metoprolol tartrate (LOPRESSOR) 50 MG tablet TAKE ONE AND ONE-HALF TABLETS BY MOUTH TWICE A DAY FOR HEART. INCREASED DOSE. 02/01/21  Yes [provider]  mirtazapine (REMERON) 30 MG tablet Take 1 tablet by mouth at bedtime. 04/12/21  Yes [provider]  Multiple Vitamins-Minerals (MULTIVITAMIN ADULT, MINERALS,) TABS TAKE 1 TABLET BY MOUTH DAILY DIETARY SUPPLEMENT 02/01/21  Yes [provider]  ondansetron (ZOFRAN) 8 MG tablet TAKE ONE-HALF TABLET BY MOUTH THREE TIMES A DAY AS NEEDED FOR NAUSEA 05/17/21  Yes [provider]  oxybutynin (DITROPAN-XL) 5 MG 24 hr tablet Take 1 tablet by mouth daily as needed. 11/19/20  Yes [provider]  Propylene Glycol 0.6 % SOLN INSTILL 1 DROP IN BOTH EYES FOUR TIMES A DAY AS NEEDED 05/19/20  Yes [provider]  QUEtiapine (SEROQUEL) 300 MG tablet TAKE ONE-HALF TABLET BY MOUTH AT BEDTIME FOR MENTAL HEALTH 01/04/21  Yes [provider]   tamsulosin (FLOMAX) 0.4 MG CAPS capsule Take 1 capsule by mouth 2 (two) times daily. 11/19/20  Yes [provider]  acyclovir (ZOVIRAX) 400 MG tablet Take 1 tablet (400 mg total) by mouth 4 (four) times daily. 07/19/12   Julianne Rice, MD  artificial tears (LACRILUBE) OINT ophthalmic ointment Apply to eye every 4 (four) hours as needed. 07/19/12   Julianne Rice, MD  etodolac (LODINE) 400 MG tablet Take 400 mg by mouth 2 (two) times daily.    [provider]  losartan-hydrochlorothiazide (HYZAAR) 100-25 MG per tablet Take 1 tablet by mouth daily.    [provider]  metroNIDAZOLE (FLAGYL) 500 MG tablet Take 500 mg by mouth 3 (three) times daily. For 7 days only. Stop date:07/20/12    [provider]  potassium chloride 20 MEQ/15ML (10%) SOLN TAKE 30MEQ BY MOUTH EVERY MORNING AND TAKE 30 MEQ (22.5 ML) 20MEQ/15ML EVERY AFTERNOON (MUST DILUTE AS INSTRUCTED) *DIFFERENT  DOSE / DIRECTIONS* 02/08/21 05/17/21  [provider]  predniSONE (DELTASONE) 50 MG tablet Take 1 tablet (50 mg total) by mouth daily. 07/19/12   Julianne Rice, MD    Physical Exam: Vitals:   05/17/21 1500 05/17/21 1546 05/17/21 1718 05/17/21 1800  BP: 137/87   132/87  Pulse: 77   81  Resp: 16   (!) 23  Temp:   97.9 F (36.6 C)   TempSrc:   Oral   SpO2: 98%   97%  Weight:  102.1 kg    Height:  5\' 11"  (1.803 m)      Constitutional: Looks acutely ill.  NAD, calm, comfortable Eyes: PERRL, lids and conjunctivae normal ENMT: Mucous membranes and lips are dry.  Posterior pharynx clear of any exudate or lesions. Neck: normal, supple, no masses, no thyromegaly Respiratory: clear to auscultation bilaterally, no wheezing, no crackles. Normal respiratory effort. No accessory muscle use.  Cardiovascular: Regular rate and rhythm, no murmurs / rubs / gallops. No extremity edema. 2+ pedal pulses. No carotid bruits.  Abdomen: Obese, no distention.  Soft, no tenderness, no masses palpated. No  hepatosplenomegaly. Bowel sounds positive.  Musculoskeletal: Mild generalized weakness.  No clubbing / cyanosis. No joint deformity upper and lower extremities. Good ROM, no contractures. Normal muscle tone.  Skin: no rashes, lesions, ulcers on very limited dermatological examination. Neurologic: CN 2-12 grossly intact. Sensation intact, DTR normal. Strength seems to be symmetric. Psychiatric: Normal judgment and insight. Alert and oriented x 3. Normal mood.   Labs on Admission: I have personally reviewed following labs and imaging studies  CBC: Recent Labs  Lab 05/17/21 1421  WBC 12.7*  HGB 15.7  HCT 43.8  MCV 83.1  PLT 99991111    Basic Metabolic Panel: Recent Labs  Lab 05/17/21 1421  NA 123*  K 4.7  CL 84*  CO2 17*  GLUCOSE 787*  BUN 33*  CREATININE 2.06*  CALCIUM 9.2  MG 2.4    GFR: Estimated Creatinine Clearance: 46.4 mL/min (A) (by C-G formula based on SCr of 2.06 mg/dL (H)).  Liver Function Tests: Recent Labs  Lab 05/17/21 1421  AST 16  ALT 27  ALKPHOS 168*  BILITOT 1.5*  PROT 8.3*  ALBUMIN 4.2    Urine analysis:    Component Value Date/Time   COLORURINE STRAW (A) 05/17/2021 1641   APPEARANCEUR CLEAR 05/17/2021 1641   LABSPEC 1.023 05/17/2021 1641   PHURINE 5.0 05/17/2021 1641   GLUCOSEU >=500 (A) 05/17/2021 1641   HGBUR SMALL (A) 05/17/2021 1641   BILIRUBINUR NEGATIVE 05/17/2021 1641   KETONESUR 20 (A) 05/17/2021 1641   PROTEINUR NEGATIVE 05/17/2021 1641   NITRITE NEGATIVE 05/17/2021 1641   LEUKOCYTESUR NEGATIVE 05/17/2021 1641    Radiological Exams on Admission: No results found.  EKG: Independently reviewed.  Vent. rate 79 BPM PR interval 164 ms QRS duration 115 ms QT/QTcB 389/446 ms P-R-T axes 69 57 57 Sinus rhythm Nonspecific intraventricular conduction delay Low voltage, precordial leads  Assessment/Plan Principal Problem:   DKA (diabetic ketoacidosis) (HCC) Observation/stepdown. Keep NPO. Continue IV fluids. Continue insulin  infusion. Antiemetics as needed. Analgesics for abdominal pain as needed. Monitor and replace electrolytes as needed. Consult diabetes coordinator. Check hemoglobin A1c level.  Active Problems:   Elevated serum creatinine Likely AKI. Hold hydrochlorothiazide. Hold losartan. Avoid hypotension. Avoid nephrotoxins. Monitor intake and output. Follow-up renal function electrolytes in AM.    Hypertension Hold diuretic and ARB. Hold etodolac.    Hyperlipidemia Continue statin once tolerating oral intake.  Class 1 obesity Lifestyle modifications. Follow-up closely with PCP.    Chronic obstructive pulmonary disease (HCC) Supplemental oxygen and bronchodilators as needed.    DVT prophylaxis:  Lovenox SQ. Code Status:   Full code. Family Communication:   Disposition Plan:   Patient is from:  Home.  Anticipated DC to:  Home.    Anticipated DC date:  05/18/2021 or 05/19/2021.  Anticipated DC barriers: Clinical status. Consults called:   Admission status:  Observation/stepdown.    Severity of Illness:High severity in the setting of DKA.  Reubin Milan MD [facility]  How to contact the [facility] Attending or Consulting provider [place] or covering provider during after hours [place], for this patient?   Check the care team in [facility] and look for a) attending/consulting [facility] provider listed and b) the [facility] team listed Log into [email or web address] and use [place] universal password to access. If you do not have the password, please contact the hospital operator. Locate the [facility] provider you are looking for under [facility] and page to a number that you can be directly reached. If you still have difficulty reaching the provider, please page the [facility] (Director on Call) for the Hospitalists listed on amion for assistance.  05/17/2021, 6:23 PM   This document was prepared using [employer] and may contain some unintended transcription errors.

## 2021-05-17 NOTE — ED Provider Notes (Signed)
[facility]-EMERGENCY DEPT Provider Note   CSN: [id number] Arrival date & time: 05/17/21  1324     History Chief Complaint  Patient presents with   Abnormal Lab    Peter Zamora is a 60 y.o. male.  HPI 60 year old male presents with fatigue and hyperglycemia/kidney injury.  The patient tells me that his been feeling fatigued for 3-4 weeks.  He lost about 40 pounds over this time unintentionally.  He went to his PCP today at the [place] for a checkup and they checked blood work and noted him to have a creatinine of 2.2, sodium of 120 and a glucose of around 900.  He has no prior history of diabetes.  He denies any pain but he has been feeling nauseated recently.   Past Medical History:  Diagnosis Date   Hypertension     Patient Active Problem List   Diagnosis Date Noted   DKA (diabetic ketoacidosis) (HCC) 05/17/2021    History reviewed. No pertinent surgical history.     History reviewed. No pertinent family history.     Home Medications Prior to Admission medications   Medication Sig Start Date End Date Taking? Authorizing Provider  albuterol (VENTOLIN HFA) 108 (90 Base) MCG/ACT inhaler INHALE 1 PUFF BY MOUTH FOUR TIMES A DAY AS NEEDED FOR SHORTNESS OF BREATH 09/05/19  Yes [provider]  aspirin 81 MG EC tablet TAKE ONE TABLET BY MOUTH DAILY FOR POOR CIRCULATION 05/17/21  Yes [provider]  Carboxymethylcellulose Sodium 0.25 % SOLN INSTILL 1 DROP IN [facility] EYE 2-4 TIMES DAILY 11/04/20  Yes [provider]  cetirizine (ZYRTEC) 10 MG tablet Take 1 tablet by mouth daily. 02/01/21  Yes [provider]  chlorthalidone (HYGROTON) 25 MG tablet TAKE ONE-HALF TABLET BY MOUTH EVERY MORNING FOR BLOOD PRESSURE 02/01/21  Yes [provider]  DULoxetine (CYMBALTA) 30 MG capsule Take 1 capsule by mouth 2 (two) times daily. 04/12/21  Yes [provider]  Eyelid Cleansers (AVENOVA) 0.01 % SOLN APPLY 1 SPRAY TO  AFFECTED AREA TWICE A DAY AS DIRECTED 05/19/20  Yes [provider]  finasteride (PROSCAR) 5 MG tablet Take 1 tablet by mouth daily. 11/19/20  Yes [provider]  fluticasone (FLONASE) 50 MCG/ACT nasal spray INSTILL 1 SPRAY IN EACH NOSTRIL AT BEDTIME - USE NIGHTLY FOR 2 WEEKS, THEN NIGHTLY IF NEEDED. 02/01/21  Yes [provider]  gabapentin (NEURONTIN) 300 MG capsule TAKE ONE CAPSULE BY MOUTH IN THE MORNING AND EVENING AND TAKE TWO CAPSULES AT BEDTIME 10/05/20  Yes [provider]  guaifenesin (HUMIBID E) 400 MG TABS tablet TAKE ONE TABLET BY MOUTH EVERY 6 HOURS AS NEEDED FOR COUGH 10/22/19  Yes [provider]  lidocaine (LIDODERM) 5 % APPLY 1 PATCH TO SKIN ONCE DAILY (APPLY FOR 12 HOURS, THEN REMOVE FOR 12 HOURS) 02/01/21  Yes [provider]  losartan (COZAAR) 100 MG tablet TAKE ONE TABLET BY MOUTH EVERY DAY FOR HEART 02/01/21  Yes [provider]  lovastatin (MEVACOR) 20 MG tablet TAKE ONE TABLET BY MOUTH ONCE A DAY FOR CHOLESTEROL 02/01/21  Yes [provider]  Magnesium Oxide 420 MG TABS TAKE ONE TABLET BY MOUTH DAILY TAKE TO TREAT LOW MAGNESIUM 02/01/21  Yes [provider]  metoprolol tartrate (LOPRESSOR) 50 MG tablet TAKE ONE AND ONE-HALF TABLETS BY MOUTH TWICE A DAY FOR HEART. INCREASED DOSE. 02/01/21  Yes [provider]  mirtazapine (REMERON) 30 MG tablet Take 1 tablet by mouth at bedtime. 04/12/21  Yes [provider]  Multiple Vitamins-Minerals (MULTIVITAMIN ADULT, MINERALS,) TABS TAKE 1 TABLET BY MOUTH DAILY DIETARY SUPPLEMENT 02/01/21  Yes [provider]  ondansetron (ZOFRAN) 8 MG tablet TAKE ONE-HALF TABLET BY MOUTH THREE TIMES A DAY AS NEEDED FOR NAUSEA 05/17/21  Yes [provider]  oxybutynin (DITROPAN-XL) 5 MG 24 hr tablet Take 1 tablet by mouth daily as needed. 11/19/20  Yes [provider]  Propylene Glycol 0.6 % SOLN INSTILL 1 DROP IN BOTH EYES FOUR TIMES A DAY AS  NEEDED 05/19/20  Yes [provider]  QUEtiapine (SEROQUEL) 300 MG tablet TAKE ONE-HALF TABLET BY MOUTH AT BEDTIME FOR MENTAL HEALTH 01/04/21  Yes [provider]  tamsulosin (FLOMAX) 0.4 MG CAPS capsule Take 1 capsule by mouth 2 (two) times daily. 11/19/20  Yes [provider]  acyclovir (ZOVIRAX) 400 MG tablet Take 1 tablet (400 mg total) by mouth 4 (four) times daily. 07/19/12   Loren Racer, MD  artificial tears (LACRILUBE) OINT ophthalmic ointment Apply to eye every 4 (four) hours as needed. 07/19/12   Loren Racer, MD  etodolac (LODINE) 400 MG tablet Take 400 mg by mouth 2 (two) times daily.    [provider]  losartan-hydrochlorothiazide (HYZAAR) 100-25 MG per tablet Take 1 tablet by mouth daily.    [provider]  metroNIDAZOLE (FLAGYL) 500 MG tablet Take 500 mg by mouth 3 (three) times daily. For 7 days only. Stop date:07/20/12    [provider]  potassium chloride 20 MEQ/15ML (10%) SOLN TAKE BY MOUTH EVERY MORNING AND TAKE 30 MEQ (22.5 ML) 20MEQ/15ML EVERY AFTERNOON (MUST DILUTE AS INSTRUCTED) *DIFFERENT DOSE / DIRECTIONS* 02/08/21 05/17/21  [provider]  predniSONE (DELTASONE) 50 MG tablet Take 1 tablet (50 mg total) by mouth daily. 07/19/12   Loren Racer, MD    Allergies    Diclofenac, Latex, and Melatonin  Review of Systems   Review of Systems  Constitutional:  Positive for fatigue and unexpected weight change. Negative for fever.  Respiratory:  Negative for shortness of breath.   Cardiovascular:  Negative for chest pain.  Gastrointestinal:  Positive for nausea. Negative for abdominal pain and vomiting.  Neurological:  Negative for weakness, numbness and headaches.  All other systems reviewed and are negative.  Physical Exam Updated Vital Signs BP 137/87    Pulse 77    Temp 98.7 F (37.1 C) (Oral)    Resp 16    Ht 5\' 11"  (1.803 m)    Wt 102.1 kg    SpO2 98%    BMI 31.38 kg/m   Physical  Exam Vitals and nursing note reviewed.  Constitutional:      General: He is not in acute distress.    Appearance: He is well-developed. He is not ill-appearing or diaphoretic.  HENT:     Head: Normocephalic and atraumatic.     Right Ear: External ear normal.     Left Ear: External ear normal.     Nose: Nose normal.     Mouth/Throat:     Mouth: Mucous membranes are dry.  Eyes:     General:        Right eye: No discharge.        Left eye: No discharge.  Cardiovascular:     Rate and Rhythm: Normal rate and regular rhythm.     Heart sounds: Normal heart sounds.  Pulmonary:     Effort: Pulmonary effort is normal.     Breath sounds: Normal breath sounds.  Abdominal:     General: There is no distension.     Palpations: Abdomen is soft.     Tenderness: There is no abdominal tenderness.  Musculoskeletal:     Cervical back: Neck supple.  Skin:    General: Skin is warm and dry.  Neurological:     Mental Status: He is alert.     Comments: 5/5 strength in all 4 extremities. Grossly normal sensation  Psychiatric:        Mood and Affect: Mood is not anxious.    ED Results / Procedures / Treatments   Labs (all labs ordered are listed, but only abnormal results are displayed) Labs Reviewed  CBC - Abnormal; Notable for the following components:      Result Value   WBC 12.7 (*)    All other components within normal limits  COMPREHENSIVE METABOLIC PANEL - Abnormal; Notable for the following components:   Sodium 123 (*)    Chloride 84 (*)    CO2 17 (*)    Glucose, Bld 787 (*)    BUN 33 (*)    Creatinine, Ser 2.06 (*)    Total Protein 8.3 (*)    Alkaline Phosphatase 168 (*)    Total Bilirubin 1.5 (*)    GFR, Estimated 36 (*)    Anion gap >20 (*)    All other components within normal limits  BLOOD GAS, VENOUS - Abnormal; Notable for the following components:   pCO2, Ven 41.8 (*)    pO2, Ven <31.0 (*)    Bicarbonate 18.0 (*)    Acid-base deficit 8.5 (*)    All other components  within normal limits  BETA-HYDROXYBUTYRIC ACID - Abnormal; Notable for the following components:   Beta-Hydroxybutyric Acid 7.59 (*)    All other components within normal limits  CBG MONITORING, ED - Abnormal; Notable for the following components:   Glucose-Capillary >600 (*)    All other components within normal limits  RESP PANEL BY RT-PCR (FLU A&B, COVID) ARPGX2  TSH  MAGNESIUM  URINALYSIS, ROUTINE W REFLEX MICROSCOPIC  BASIC METABOLIC PANEL  BASIC METABOLIC PANEL  BASIC METABOLIC PANEL  BETA-HYDROXYBUTYRIC ACID  BETA-HYDROXYBUTYRIC ACID  HEMOGLOBIN A1C    EKG EKG Interpretation  Date/Time:  Tuesday May 17 2021 14:19:48 EST Ventricular Rate:  79 PR Interval:  164 QRS Duration: 115 QT Interval:  389 QTC Calculation: 446 R Axis:   57 Text Interpretation: Sinus rhythm Nonspecific intraventricular conduction delay Low voltage, precordial leads Confirmed by Pricilla Loveless [phone]) on 05/17/2021 2:51:58 PM  Radiology No results found.  Procedures .Critical Care Performed by: Pricilla Loveless, MD Authorized by: Pricilla Loveless, MD   Critical care provider statement:    Critical care time (minutes):  35   Critical care time was exclusive of:  Separately billable procedures and treating other patients   Critical care was necessary to treat or prevent imminent or life-threatening deterioration of the following conditions:  Renal failure and endocrine crisis   Critical care was time spent personally by me on the following activities:  Development of treatment plan with patient or surrogate, discussions with consultants, evaluation of patient's response to treatment, examination of patient, ordering and review of laboratory studies, ordering and review of radiographic studies, ordering and performing treatments and interventions, pulse oximetry, re-evaluation of patient's condition and review of old charts   Medications Ordered in ED Medications  lactated ringers bolus 1,021  mL (has no administration in time range)  insulin regular, human (MYXREDLIN) 100 units/ 100 mL  infusion (13 Units/hr Intravenous New Bag/Given 05/17/21 1553)  lactated ringers infusion (has no administration in time range)  potassium chloride 10 mEq in 100 mL IVPB (10 mEq Intravenous New Bag/Given 05/17/21 1550)  lactated ringers infusion ( Intravenous New Bag/Given 05/17/21 1548)  dextrose 5 % in lactated ringers infusion (has no administration in time range)  dextrose 50 % solution 0-50 mL (has no administration in time range)  enoxaparin (LOVENOX) injection 50 mg (has no administration in time range)  acetaminophen (TYLENOL) tablet 650 mg (has no administration in time range)    Or  acetaminophen (TYLENOL) suppository 650 mg (has no administration in time range)  ondansetron (ZOFRAN) tablet 4 mg (has no administration in time range)    Or  ondansetron (ZOFRAN) injection 4 mg (has no administration in time range)  lactated ringers bolus 1,000 mL (1,000 mLs Intravenous New Bag/Given 05/17/21 1508)  ondansetron (ZOFRAN) injection 4 mg (4 mg Intravenous Given 05/17/21 1508)    ED Course  I have reviewed the triage vital signs and the nursing notes.  Pertinent labs & imaging results that were available during my care of the patient were reviewed by me and considered in my medical decision making (see chart for details).    MDM Rules/Calculators/A&P                         Patient presents with new type 2 diabetes/hyperglycemia that has turned into a DKA.  He has evidence of what is likely an acute kidney injury based on report from [place] as well as acidosis consistent with DKA.  He was started on IV fluids and will be given potassium supplementation while started on insulin drip.  He is otherwise stable.  Will admit to the hospitalist service.    Final Clinical Impression(s) / ED Diagnoses Final diagnoses:  Diabetic ketoacidosis without coma associated with type 2 diabetes mellitus (HCC)     Rx / DC Orders ED Discharge Orders     None        Pricilla Loveless, MD 05/17/21 1556

## 2021-05-17 NOTE — ED Triage Notes (Signed)
Pt presents with c/o abnormal labs. [facility]. Pt reports CBG was 905, sodium was 120. Pt denies any pain.

## 2021-05-18 DIAGNOSIS — Z7982 Long term (current) use of aspirin: Secondary | ICD-10-CM | POA: Diagnosis not present

## 2021-05-18 DIAGNOSIS — Z96652 Presence of left artificial knee joint: Secondary | ICD-10-CM | POA: Diagnosis present

## 2021-05-18 DIAGNOSIS — D72829 Elevated white blood cell count, unspecified: Secondary | ICD-10-CM | POA: Diagnosis present

## 2021-05-18 DIAGNOSIS — J449 Chronic obstructive pulmonary disease, unspecified: Secondary | ICD-10-CM | POA: Diagnosis present

## 2021-05-18 DIAGNOSIS — Z833 Family history of diabetes mellitus: Secondary | ICD-10-CM | POA: Diagnosis not present

## 2021-05-18 DIAGNOSIS — Z8249 Family history of ischemic heart disease and other diseases of the circulatory system: Secondary | ICD-10-CM | POA: Diagnosis not present

## 2021-05-18 DIAGNOSIS — R17 Unspecified jaundice: Secondary | ICD-10-CM | POA: Diagnosis present

## 2021-05-18 DIAGNOSIS — Z6831 Body mass index (BMI) 31.0-31.9, adult: Secondary | ICD-10-CM | POA: Diagnosis not present

## 2021-05-18 DIAGNOSIS — E669 Obesity, unspecified: Secondary | ICD-10-CM | POA: Diagnosis present

## 2021-05-18 DIAGNOSIS — F439 Reaction to severe stress, unspecified: Secondary | ICD-10-CM | POA: Diagnosis present

## 2021-05-18 DIAGNOSIS — E1110 Type 2 diabetes mellitus with ketoacidosis without coma: Principal | ICD-10-CM

## 2021-05-18 DIAGNOSIS — Z20822 Contact with and (suspected) exposure to covid-19: Secondary | ICD-10-CM | POA: Diagnosis present

## 2021-05-18 DIAGNOSIS — M79674 Pain in right toe(s): Secondary | ICD-10-CM | POA: Diagnosis present

## 2021-05-18 DIAGNOSIS — E860 Dehydration: Secondary | ICD-10-CM | POA: Diagnosis present

## 2021-05-18 DIAGNOSIS — I10 Essential (primary) hypertension: Secondary | ICD-10-CM | POA: Diagnosis present

## 2021-05-18 DIAGNOSIS — Z7952 Long term (current) use of systemic steroids: Secondary | ICD-10-CM | POA: Diagnosis not present

## 2021-05-18 DIAGNOSIS — E785 Hyperlipidemia, unspecified: Secondary | ICD-10-CM | POA: Diagnosis present

## 2021-05-18 DIAGNOSIS — F39 Unspecified mood [affective] disorder: Secondary | ICD-10-CM | POA: Diagnosis present

## 2021-05-18 DIAGNOSIS — N179 Acute kidney failure, unspecified: Secondary | ICD-10-CM | POA: Diagnosis present

## 2021-05-18 DIAGNOSIS — Z79899 Other long term (current) drug therapy: Secondary | ICD-10-CM | POA: Diagnosis not present

## 2021-05-18 DIAGNOSIS — E1142 Type 2 diabetes mellitus with diabetic polyneuropathy: Secondary | ICD-10-CM | POA: Diagnosis present

## 2021-05-18 DIAGNOSIS — E876 Hypokalemia: Secondary | ICD-10-CM | POA: Diagnosis present

## 2021-05-18 LAB — GLUCOSE, CAPILLARY
Glucose-Capillary: 167 mg/dL — ABNORMAL HIGH (ref 70–99)
Glucose-Capillary: 167 mg/dL — ABNORMAL HIGH (ref 70–99)
Glucose-Capillary: 178 mg/dL — ABNORMAL HIGH (ref 70–99)
Glucose-Capillary: 179 mg/dL — ABNORMAL HIGH (ref 70–99)
Glucose-Capillary: 182 mg/dL — ABNORMAL HIGH (ref 70–99)
Glucose-Capillary: 191 mg/dL — ABNORMAL HIGH (ref 70–99)
Glucose-Capillary: 192 mg/dL — ABNORMAL HIGH (ref 70–99)
Glucose-Capillary: 193 mg/dL — ABNORMAL HIGH (ref 70–99)
Glucose-Capillary: 198 mg/dL — ABNORMAL HIGH (ref 70–99)
Glucose-Capillary: 204 mg/dL — ABNORMAL HIGH (ref 70–99)
Glucose-Capillary: 210 mg/dL — ABNORMAL HIGH (ref 70–99)
Glucose-Capillary: 216 mg/dL — ABNORMAL HIGH (ref 70–99)
Glucose-Capillary: 218 mg/dL — ABNORMAL HIGH (ref 70–99)
Glucose-Capillary: 266 mg/dL — ABNORMAL HIGH (ref 70–99)

## 2021-05-18 LAB — CBC
HCT: 36.3 % — ABNORMAL LOW (ref 39.0–52.0)
Hemoglobin: 13.5 g/dL (ref 13.0–17.0)
MCH: 30.4 pg (ref 26.0–34.0)
MCHC: 37.2 g/dL — ABNORMAL HIGH (ref 30.0–36.0)
MCV: 81.8 fL (ref 80.0–100.0)
Platelets: 147 10*3/uL — ABNORMAL LOW (ref 150–400)
RBC: 4.44 MIL/uL (ref 4.22–5.81)
RDW: 12.1 % (ref 11.5–15.5)
WBC: 10.1 10*3/uL (ref 4.0–10.5)
nRBC: 0 % (ref 0.0–0.2)

## 2021-05-18 LAB — BASIC METABOLIC PANEL
Anion gap: 10 (ref 5–15)
Anion gap: 9 (ref 5–15)
BUN: 22 mg/dL — ABNORMAL HIGH (ref 6–20)
BUN: 28 mg/dL — ABNORMAL HIGH (ref 6–20)
CO2: 23 mmol/L (ref 22–32)
CO2: 23 mmol/L (ref 22–32)
Calcium: 9 mg/dL (ref 8.9–10.3)
Calcium: 9.3 mg/dL (ref 8.9–10.3)
Chloride: 100 mmol/L (ref 98–111)
Chloride: 102 mmol/L (ref 98–111)
Creatinine, Ser: 1.12 mg/dL (ref 0.61–1.24)
Creatinine, Ser: 1.38 mg/dL — ABNORMAL HIGH (ref 0.61–1.24)
GFR, Estimated: 59 mL/min — ABNORMAL LOW (ref >60)
GFR, Estimated: >60 mL/min (ref >60)
Glucose, Bld: 197 mg/dL — ABNORMAL HIGH (ref 70–99)
Glucose, Bld: 199 mg/dL — ABNORMAL HIGH (ref 70–99)
Potassium: 3.2 mmol/L — ABNORMAL LOW (ref 3.5–5.1)
Potassium: 3.2 mmol/L — ABNORMAL LOW (ref 3.5–5.1)
Sodium: 133 mmol/L — ABNORMAL LOW (ref 135–145)
Sodium: 134 mmol/L — ABNORMAL LOW (ref 135–145)

## 2021-05-18 LAB — BETA-HYDROXYBUTYRIC ACID
Beta-Hydroxybutyric Acid: 0.53 mmol/L — ABNORMAL HIGH (ref 0.05–0.27)
Beta-Hydroxybutyric Acid: 1.55 mmol/L — ABNORMAL HIGH (ref 0.05–0.27)

## 2021-05-18 LAB — HIV ANTIBODY (ROUTINE TESTING W REFLEX): HIV Screen 4th Generation wRfx: NONREACTIVE

## 2021-05-18 MED ORDER — INSULIN GLARGINE-YFGN 100 UNIT/ML ~~LOC~~ SOLN
24.0000 [IU] | Freq: Every day | SUBCUTANEOUS | Status: DC
  Administered 2021-05-18 – 2021-05-19 (×2): 24 [IU] via SUBCUTANEOUS
  Filled 2021-05-18 (×2): qty 0.24

## 2021-05-18 MED ORDER — INSULIN ASPART 100 UNIT/ML IJ SOLN
0.0000 [IU] | Freq: Every day | INTRAMUSCULAR | Status: DC
  Administered 2021-05-18: 22:00:00 3 [IU] via SUBCUTANEOUS

## 2021-05-18 MED ORDER — LIVING WELL WITH DIABETES BOOK
Freq: Once | Status: AC
  Filled 2021-05-18: qty 1

## 2021-05-18 MED ORDER — INSULIN STARTER KIT- PEN NEEDLES (ENGLISH)
1.0000 | Freq: Once | Status: AC
  Administered 2021-05-18: 11:00:00 1
  Filled 2021-05-18: qty 1

## 2021-05-18 MED ORDER — LACTATED RINGERS IV SOLN: INTRAVENOUS | Status: DC

## 2021-05-18 MED ORDER — POTASSIUM CHLORIDE CRYS ER 20 MEQ PO TBCR
40.0000 meq | EXTENDED_RELEASE_TABLET | ORAL | Status: AC
  Administered 2021-05-18 (×2): 40 meq via ORAL
  Filled 2021-05-18 (×2): qty 2

## 2021-05-18 MED ORDER — INSULIN ASPART 100 UNIT/ML IJ SOLN
0.0000 [IU] | Freq: Three times a day (TID) | INTRAMUSCULAR | Status: DC
  Administered 2021-05-18: 18:00:00 13 [IU] via SUBCUTANEOUS
  Administered 2021-05-18: 11:00:00 4 [IU] via SUBCUTANEOUS
  Administered 2021-05-19: 12:00:00 11 [IU] via SUBCUTANEOUS
  Administered 2021-05-19: 08:00:00 7 [IU] via SUBCUTANEOUS

## 2021-05-18 MED ORDER — INSULIN ASPART 100 UNIT/ML IJ SOLN
6.0000 [IU] | Freq: Three times a day (TID) | INTRAMUSCULAR | Status: DC
  Administered 2021-05-18 – 2021-05-19 (×4): 6 [IU] via SUBCUTANEOUS

## 2021-05-18 NOTE — Assessment & Plan Note (Signed)
Continue statin. 

## 2021-05-18 NOTE — Assessment & Plan Note (Addendum)
Appears to be new onset diabetes. Hemoglobin A1c ordered. Anion gap resolved. Transition to subcutaneous regimen. 28 units of Lantus plus metformin and farixga.

## 2021-05-18 NOTE — Assessment & Plan Note (Addendum)
Blood pressure stable. Continue ARB Monitor.

## 2021-05-18 NOTE — Progress Notes (Addendum)
Inpatient Diabetes Program Recommendations  AACE/ADA: New Consensus Statement on Inpatient Glycemic Control (2015)  Target Ranges:  Prepandial:   less than 140 mg/dL      Peak postprandial:   less than 180 mg/dL (1-2 hours)      Critically ill patients:  140 - 180 mg/dL   Lab Results  Component Value Date   GLUCAP 167 (H) 05/18/2021    Review of Glycemic Control  Diabetes history: None - New-onset Outpatient Diabetes medications: N/A Current orders for Inpatient glycemic control: IV insulin per [employer] for DKA; transitioned to Semglee 24 units QD, Novolog 0-20 units TID with meals and 0-5 HS + 6 units TID  Started insulin drip on 12/27 at approx 1600. Transitioning to SQ insulin and was given Semglee 24 units @ 1000. New-onset DM. HgbA1C pending Will likely need insulin when discharged.  Inpatient Diabetes Program Recommendations:    Agree with orders.  Will see pt at bedside and begin DM education. Ordered Living Well with Diabetes book and Insulin Starter Kit.  Thank you. Lorenda Peck, RD, LDN, CDE Inpatient Diabetes Coordinator [phone]   Addendum:  Spoke with patient about new diabetes diagnosis, basic pathophysiology of DM Type 2, basic home care, importance of checking CBGs and maintaining good CBG control to prevent long-term and short-term complications. Reviewed glucose and explained that patient will need to continue to  Reviewed signs and symptoms of hyperglycemia and hypoglycemia along with treatment for both. Discussed impact of nutrition, exercise, stress, sickness, and medications on diabetes control. Reviewed Living Well with diabetes booklet and encouraged patient to read through entire book. IAsked patient to check his glucose 4 times per day (before meals and at bedtime) and to keep a log book of glucose readings and insulin taken. Explained how the doctor he follows up with can use the log book to continue to make insulin adjustments if needed.   Patient verbalized understanding of information discussed and he states that he has no further questions at this time related to diabetes.   RNs to provide ongoing basic DM education at bedside with this patient and engage patient to actively check blood glucose and administer insulin injections.  Educated patient on insulin pen use at home. Reviewed contents of insulin flexpen starter kit. Reviewed all steps if insulin pen including attachment of needle, 2-unit air shot, dialing up dose, giving injection, removing needle, disposal of sharps, storage of unused insulin, disposal of insulin etc. Patient able to provide successful return demonstration. Also reviewed troubleshooting with insulin pen. MD to give patient Rxs for insulin pens and insulin pen needles.   Pt requests all diabetes prescriptions, pen needles, glucose meter be sent to [place]. Said [place] will overnight his medications.  Will f/u in am.  Thank you. Lorenda Peck, RD, LDN, CDE Inpatient Diabetes Coordinator [phone]

## 2021-05-18 NOTE — Assessment & Plan Note (Signed)
Body mass index is 31.38 kg/m.  Placing the patient at high risk of poor outcome. Patient actually lost significant weight over last few months secondary to diabetes.

## 2021-05-18 NOTE — Assessment & Plan Note (Addendum)
Secondary to dehydration with DKA. Resume ARB

## 2021-05-18 NOTE — Progress Notes (Signed)
°  Progress Note   Patient: Peter Zamora MCR:[id number] DOB: [birth year] DOA: 05/17/2021     0 DOS: the patient was seen and examined on 05/18/2021   Brief hospital course: No notes on file  Assessment and Plan * DKA (diabetic ketoacidosis) (HCC)- (present on admission) Appears to be new onset diabetes. Hemoglobin A1c ordered. Anion gap resolved. Transition to subcutaneous regimen. Advance diet. Continue with IV fluids. Ideally patient will require 20 units of Lantus but given his severe DKA as well as high requirement for IV insulin I will provide 24 units of Lantus. Continue basal bolus regimen.  Provide education.  Class 1 obesity- (present on admission) Body mass index is 31.38 kg/m.  Placing the patient at high risk of poor outcome. Patient actually lost significant weight over last few months secondary to diabetes.  Hyperlipidemia- (present on admission) Continue statin.  Hypertension- (present on admission) Blood pressure stable. Monitor.  Elevated serum creatinine- (present on admission) Secondary to antihypertensive regimen as well as dehydration with DKA. Monitor.     Subjective: No nausea no vomiting or no fever no chills.  Abdominal pain resolved.  Objective Vital signs were reviewed and unremarkable. General: Appear in mild distress, no Rash; Oral Mucosa Clear, moist. no Abnormal Neck Mass Or lumps, Conjunctiva normal  Cardiovascular: S1 and S2 Present, no Murmur, Respiratory: good respiratory effort, Bilateral Air entry present and CTA, no Crackles, no wheezes Abdomen: Bowel Sound present, Soft and no tenderness Extremities: no Pedal edema Neurology: alert and oriented to time, place, and person affect appropriate. no new focal deficit Gait not checked due to patient safety concerns    Data Reviewed: Anion gap closed.  Hyperglycemia improving.  Family Communication: None at bedside  Disposition: Status is: Inpatient  Remains inpatient  appropriate because: Ongoing treatment for hyperglycemia and ongoing need for IV fluids.         Time spent: 35 minutes  Author: Lynden Oxford 05/18/2021 9:06 PM  For on call review [email or web address].

## 2021-05-19 DIAGNOSIS — E119 Type 2 diabetes mellitus without complications: Secondary | ICD-10-CM

## 2021-05-19 DIAGNOSIS — E876 Hypokalemia: Secondary | ICD-10-CM | POA: Diagnosis present

## 2021-05-19 DIAGNOSIS — D72829 Elevated white blood cell count, unspecified: Secondary | ICD-10-CM | POA: Diagnosis present

## 2021-05-19 DIAGNOSIS — R7989 Other specified abnormal findings of blood chemistry: Secondary | ICD-10-CM | POA: Diagnosis present

## 2021-05-19 DIAGNOSIS — G629 Polyneuropathy, unspecified: Secondary | ICD-10-CM

## 2021-05-19 DIAGNOSIS — F39 Unspecified mood [affective] disorder: Secondary | ICD-10-CM | POA: Diagnosis present

## 2021-05-19 DIAGNOSIS — M79674 Pain in right toe(s): Secondary | ICD-10-CM | POA: Diagnosis present

## 2021-05-19 LAB — BASIC METABOLIC PANEL
Anion gap: 10 (ref 5–15)
BUN: 17 mg/dL (ref 6–20)
CO2: 20 mmol/L — ABNORMAL LOW (ref 22–32)
Calcium: 9 mg/dL (ref 8.9–10.3)
Chloride: 102 mmol/L (ref 98–111)
Creatinine, Ser: 1.25 mg/dL — ABNORMAL HIGH (ref 0.61–1.24)
GFR, Estimated: >60 mL/min (ref >60)
Glucose, Bld: 192 mg/dL — ABNORMAL HIGH (ref 70–99)
Potassium: 3.7 mmol/L (ref 3.5–5.1)
Sodium: 132 mmol/L — ABNORMAL LOW (ref 135–145)

## 2021-05-19 LAB — GLUCOSE, CAPILLARY
Glucose-Capillary: 226 mg/dL — ABNORMAL HIGH (ref 70–99)
Glucose-Capillary: 285 mg/dL — ABNORMAL HIGH (ref 70–99)

## 2021-05-19 LAB — HEMOGLOBIN A1C
Hgb A1c MFr Bld: >15.5 % — ABNORMAL HIGH (ref 4.8–5.6)
Hgb A1c MFr Bld: >15.5 % — ABNORMAL HIGH (ref 4.8–5.6)
Mean Plasma Glucose: >398 mg/dL
Mean Plasma Glucose: >398 mg/dL

## 2021-05-19 LAB — MAGNESIUM: Magnesium: 1.9 mg/dL (ref 1.7–2.4)

## 2021-05-19 MED ORDER — INSULIN GLARGINE 100 UNIT/ML SOLOSTAR PEN: 28.0000 [IU] | PEN_INJECTOR | Freq: Every day | SUBCUTANEOUS | 0 refills | Status: AC

## 2021-05-19 MED ORDER — DAPAGLIFLOZIN PROPANEDIOL 5 MG PO TABS: 5.0000 mg | ORAL_TABLET | Freq: Every day | ORAL | 0 refills | Status: DC

## 2021-05-19 MED ORDER — DULOXETINE HCL 30 MG PO CPEP
30.0000 mg | ORAL_CAPSULE | Freq: Two times a day (BID) | ORAL | Status: DC
  Administered 2021-05-19: 10:00:00 30 mg via ORAL
  Filled 2021-05-19: qty 1

## 2021-05-19 MED ORDER — METOPROLOL TARTRATE 50 MG PO TABS
75.0000 mg | ORAL_TABLET | Freq: Two times a day (BID) | ORAL | Status: DC
  Administered 2021-05-19: 10:00:00 75 mg via ORAL
  Filled 2021-05-19: qty 1

## 2021-05-19 MED ORDER — PRAVASTATIN SODIUM 20 MG PO TABS: 20.0000 mg | ORAL_TABLET | Freq: Every day | ORAL | Status: DC

## 2021-05-19 MED ORDER — LOSARTAN POTASSIUM 50 MG PO TABS
100.0000 mg | ORAL_TABLET | Freq: Every day | ORAL | Status: DC
  Administered 2021-05-19: 10:00:00 100 mg via ORAL
  Filled 2021-05-19: qty 2

## 2021-05-19 MED ORDER — MIRTAZAPINE 15 MG PO TABS: 30.0000 mg | ORAL_TABLET | Freq: Every day | ORAL | Status: DC

## 2021-05-19 MED ORDER — ASPIRIN EC 81 MG PO TBEC
81.0000 mg | DELAYED_RELEASE_TABLET | Freq: Every day | ORAL | Status: DC
  Administered 2021-05-19: 10:00:00 81 mg via ORAL
  Filled 2021-05-19: qty 1

## 2021-05-19 MED ORDER — INSULIN ASPART 100 UNIT/ML IJ SOLN: 8.0000 [IU] | Freq: Three times a day (TID) | INTRAMUSCULAR | Status: DC

## 2021-05-19 MED ORDER — QUETIAPINE FUMARATE 50 MG PO TABS: 150.0000 mg | ORAL_TABLET | Freq: Every day | ORAL | Status: DC

## 2021-05-19 MED ORDER — GABAPENTIN 300 MG PO CAPS
300.0000 mg | ORAL_CAPSULE | Freq: Two times a day (BID) | ORAL | Status: DC
  Administered 2021-05-19: 08:00:00 300 mg via ORAL
  Filled 2021-05-19: qty 1

## 2021-05-19 MED ORDER — INSULIN ASPART 100 UNIT/ML FLEXPEN: 8.0000 [IU] | PEN_INJECTOR | Freq: Three times a day (TID) | SUBCUTANEOUS | 0 refills | Status: DC

## 2021-05-19 MED ORDER — FINASTERIDE 5 MG PO TABS
5.0000 mg | ORAL_TABLET | Freq: Every day | ORAL | Status: DC
  Administered 2021-05-19: 10:00:00 5 mg via ORAL
  Filled 2021-05-19: qty 1

## 2021-05-19 MED ORDER — BLOOD GLUCOSE METER KIT: PACK | 0 refills | Status: DC

## 2021-05-19 MED ORDER — INSULIN GLARGINE-YFGN 100 UNIT/ML ~~LOC~~ SOLN: 28.0000 [IU] | Freq: Every day | SUBCUTANEOUS | Status: DC

## 2021-05-19 MED ORDER — "PEN NEEDLES 3/16"" 31G X 5 MM MISC": 1.0000 | Freq: Every day | 0 refills | Status: DC

## 2021-05-19 MED ORDER — TAMSULOSIN HCL 0.4 MG PO CAPS
0.4000 mg | ORAL_CAPSULE | Freq: Two times a day (BID) | ORAL | Status: DC
  Administered 2021-05-19: 10:00:00 0.4 mg via ORAL
  Filled 2021-05-19: qty 1

## 2021-05-19 MED ORDER — METFORMIN HCL 500 MG PO TABS: 500.0000 mg | ORAL_TABLET | Freq: Every day | ORAL | 0 refills | Status: AC

## 2021-05-19 MED ORDER — GABAPENTIN 300 MG PO CAPS: 600.0000 mg | ORAL_CAPSULE | Freq: Every day | ORAL | Status: DC

## 2021-05-19 NOTE — Progress Notes (Signed)
Inpatient Diabetes Program Recommendations  AACE/ADA: New Consensus Statement on Inpatient Glycemic Control (2015)  Target Ranges:  Prepandial:   less than 140 mg/dL      Peak postprandial:   less than 180 mg/dL (1-2 hours)      Critically ill patients:  140 - 180 mg/dL   Lab Results  Component Value Date   GLUCAP 226 (H) 05/19/2021    Review of Glycemic Control  Current orders for Inpatient glycemic control: Semglee 24 units QD, Novolog 0-20 units TID with meals and 0-5 HS + 6 units TID   Inpatient Diabetes Program Recommendations:    Increase Semglee to 28 units QD Increase Novolog to 8 units TID with meals.  Will need all prescriptions sent to [facility] and per pt they will FedEx overnight to his home. Asked pt about getting insulin at [facility] in case his insulin was held up for any reason and pt refuses to pay anything out of pocket.  Along with insulin, will need:  Insulin pen needles - # [id number] Blood glucose monitoring kit - # [id number]  Will follow-up with pt for any questions or concerns today.  Thank you. Lorenda Peck, RD, LDN, CDE Inpatient Diabetes Coordinator [phone]

## 2021-05-19 NOTE — Progress Notes (Signed)
Assessment unchanged. Pt verbalized understanding of dc instructions including medications and follow up care. Pt has his Living Well with Diabetes and Rhonda, Diabetic Coordinator, made rounds to see pt and ask if he had any questions. All questions answered. Discharged via foot per request to front entrance to meet son accompanied.

## 2021-05-19 NOTE — TOC Initial Note (Signed)
Transition of Care [facility]) - Initial/Assessment Note    Patient Details  Name: Peter Zamora MRN: [id number] Date of Birth: 07-21-1960  Transition of Care [facility]) CM/SW Contact:    Lanier Clam, RN Phone Number: 05/19/2021, 11:35 AM  Clinical Narrative: Patient plans to d/c home. [facility] w[facility] [facility],has [facility],own transport home. No further CM needs.                  Expected Discharge Plan: Home/Self Care Barriers to Discharge: No Barriers Identified   Patient Goals and CMS Choice        Expected Discharge Plan and Services Expected Discharge Plan: Home/Self Care       Living arrangements for the past 2 months: Single Family Home                                      Prior Living Arrangements/Services Living arrangements for the past 2 months: Single Family Home Lives with:: Adult Children   Do you feel safe going back to the place where you live?: Yes               Activities of Daily Living Home Assistive Devices/Equipment: Cane (specify quad or straight), Walker (specify type), Eyeglasses, Blood pressure cuff, CPAP ADL Screening (condition at time of admission) Patient's cognitive ability adequate to safely complete daily activities?: Yes Is the patient deaf or have difficulty hearing?: No Does the patient have difficulty seeing, even when wearing glasses/contacts?: Yes (glaucoma bilateral eyes) Does the patient have difficulty concentrating, remembering, or making decisions?: No Patient able to express need for assistance with ADLs?: Yes Does the patient have difficulty dressing or bathing?: No Independently performs ADLs?: Yes (appropriate for developmental age) Does the patient have difficulty walking or climbing stairs?: No Weakness of Legs: Both Weakness of Arms/Hands: None  Permission Sought/Granted Permission sought to share information with : Case Manager Permission granted to share  information with : Yes, Verbal Permission Granted              Emotional Assessment              Admission diagnosis:  DKA (diabetic ketoacidosis) (HCC) [E11.10] Diabetic ketoacidosis without coma associated with type 2 diabetes mellitus (HCC) [E11.10] Patient Active Problem List   Diagnosis Date Noted   DKA (diabetic ketoacidosis) (HCC) 05/17/2021   Elevated serum creatinine 05/17/2021   Hypertension    Hyperlipidemia    Class 1 obesity    Chronic obstructive pulmonary disease (HCC) 01/20/2018   PCP:  Pcp, No Pharmacy:   [facility] - [place] - [address] [place] [address] Phone: [phone] Fax: [phone]     Social Determinants of Health (SDOH) Interventions    Readmission Risk Interventions No flowsheet data found.

## 2021-05-22 NOTE — Assessment & Plan Note (Signed)
Continue inhaler

## 2021-05-22 NOTE — Assessment & Plan Note (Signed)
Due to hyperglycemia.  

## 2021-05-22 NOTE — Assessment & Plan Note (Signed)
Due to stress reaction. No evidence of acute infection.

## 2021-05-22 NOTE — Discharge Summary (Signed)
Physician Discharge Summary   Patient: Peter Zamora MRN: [id number] DOB: _0   Admit date:     05/17/2021  Discharge date: 05/19/2021  Discharge Physician: Berle Mull   PCP: Pcp, No   Recommendations at discharge: Follow up with PCP in 1 week.  Discharge Diagnoses Principal Problem:   DKA (diabetic ketoacidosis) ([place]) Active Problems:   New onset type 2 diabetes mellitus ([place])   AKI (acute kidney injury) ([place])   Pseudohyponatremia   Hypokalemia   Class 1 obesity   Chronic obstructive pulmonary disease (HCC)   Hyperlipidemia   Hyperbilirubinemia   Leucocytosis   Hypertension   Great toe pain, right   Mood disorder ([place])   Neuropathy  Resolved Problems:   * No resolved hospital problems. [facility] Course   No notes on file  * DKA (diabetic ketoacidosis) ([place])- (present on admission) Appears to be new onset diabetes. Hemoglobin A1c ordered. Anion gap resolved. Transition to subcutaneous regimen. 28 units of Lantus plus metformin and farixga.  New onset type 2 diabetes mellitus ([place]) See DKA  Hypokalemia- (present on admission) Replaced.   Pseudohyponatremia- (present on admission) Due to hyperglycemia   AKI (acute kidney injury) ([place])- (present on admission) Secondary to dehydration with DKA. Resume ARB  Class 1 obesity- (present on admission) Body mass index is 31.38 kg/m.  Placing the patient at high risk of poor outcome. Patient actually lost significant weight over last few months secondary to diabetes.  Chronic obstructive pulmonary disease ([place])- (present on admission) Continue inhaler  Leucocytosis- (present on admission) Due to stress reaction. No evidence of acute infection.  Hyperbilirubinemia- (present on admission) Mild. monitor  Hyperlipidemia- (present on admission) Continue statin.  Great toe pain, right- (present on admission) Pt had mild redness that resolved. Recommended to follow up and establish care with  podiatry.  Hypertension- (present on admission) Blood pressure stable. Continue ARB Monitor.  Mood disorder ([place])- (present on admission) Continue home regimen.   Neuropathy Continue gabapentin.      Consultants: none Procedures performed: none  Disposition: Home Diet recommendation: Carb modified diet  DISCHARGE MEDICATION: Allergies as of 05/19/2021       Reactions   Diclofenac    Other reaction(s): Itching of eye   Latex Rash, Hives   Melatonin Rash        Medication List     STOP taking these medications    chlorthalidone 25 MG tablet Commonly known as: HYGROTON   ibuprofen 800 MG tablet Commonly known as: ADVIL       TAKE these medications    albuterol 108 (90 Base) MCG/ACT inhaler Commonly known as: VENTOLIN HFA Inhale 1-2 puffs into the lungs every 6 (six) hours as needed for wheezing or shortness of breath.   aspirin 81 MG EC tablet Take 81 mg by mouth daily.   blood glucose meter kit and supplies Dispense based on patient and insurance preference. Use up to four times daily as directed. (FOR ICD-10 E10.9, E11.9).   cetirizine 10 MG tablet Commonly known as: ZYRTEC Take 10 mg by mouth daily.   dapagliflozin propanediol 5 MG Tabs tablet Commonly known as: FARXIGA Take 1 tablet (5 mg total) by mouth daily before breakfast.   DULoxetine 30 MG capsule Commonly known as: CYMBALTA Take 30 mg by mouth 2 (two) times daily.   finasteride 5 MG tablet Commonly known as: PROSCAR Take 5 mg by mouth daily.   gabapentin 300 MG capsule Commonly known as: NEURONTIN Take 300-600 mg by mouth  See admin instructions. 311m in the morning and evening 6074mat bedtime   insulin aspart 100 UNIT/ML FlexPen Commonly known as: NOVOLOG Inject 8 Units into the skin 3 (three) times daily with meals.   insulin glargine 100 UNIT/ML Solostar Pen Commonly known as: LANTUS Inject 28 Units into the skin daily.   losartan 100 MG tablet Commonly known as:  COZAAR Take 100 mg by mouth daily.   lovastatin 20 MG tablet Commonly known as: MEVACOR Take 20 mg by mouth daily.   Magnesium Oxide 420 MG Tabs Take 420 mg by mouth daily.   metFORMIN 500 MG tablet Commonly known as: Glucophage Take 1 tablet (500 mg total) by mouth daily with breakfast.   metoprolol tartrate 50 MG tablet Commonly known as: LOPRESSOR Take 75 mg by mouth 2 (two) times daily.   mirtazapine 30 MG tablet Commonly known as: REMERON Take 30 mg by mouth at bedtime.   Multivitamin Adult (Minerals) Tabs Take 1 tablet by mouth daily.   NyQuil Severe Cold/Flu 5-6.25-10-325 MG/15ML Liqd Generic drug: Phenyleph-Doxylamine-DM-APAP Take 15 mLs by mouth 2 (two) times daily as needed (sleep/congestion).   ondansetron 8 MG tablet Commonly known as: ZOFRAN Take 4 mg by mouth 3 (three) times daily as needed for nausea.   oxybutynin 5 MG 24 hr tablet Commonly known as: DITROPAN-XL Take 5 mg by mouth daily as needed (overreactive bladder).   Pen Needles 3/16" 31G X 5 MM Misc 1 Act by Does not apply route 5 (five) times daily.   QUEtiapine 300 MG tablet Commonly known as: SEROQUEL Take 150 mg by mouth at bedtime.   Systane 0.4-0.3 % Soln Generic drug: Polyethyl Glycol-Propyl Glycol Place 1 drop into both eyes 4 (four) times daily as needed (dry eyes).   tamsulosin 0.4 MG Caps capsule Commonly known as: FLOMAX Take 0.4 mg by mouth 2 (two) times daily.        Follow-up Information     PCP. Schedule an appointment as soon as possible for a visit in 1 week(s).   Why: with CBC and BMP and blood glucose log        Endocrinology. Schedule an appointment as soon as possible for a visit in 1 month(s).   Why: for new onset Diabetes        Ophthalmology. Schedule an appointment as soon as possible for a visit in 1 week(s).   Why: Eye check up, [facility]        Podiatry. Schedule an appointment as soon as possible for a visit in 2  week(s).   Why: request PCP as VS for referral.                Discharge Exam: Filed Weights   05/17/21 1546  Weight: 102.1 kg   General: Appear in mild distress, no Rash; Oral Mucosa Clear, moist. no Abnormal Neck Mass Or lumps, Conjunctiva normal  Cardiovascular: S1 and S2 Present, no Murmur, Respiratory: good respiratory effort, Bilateral Air entry present and CTA, no Crackles, no wheezes Abdomen: Bowel Sound present, Soft and no tenderness Extremities: no Pedal edema Neurology: alert and oriented to time, place, and person affect appropriate. no new focal deficit Gait not checked due to patient safety concerns   Condition at discharge: good  The results of significant diagnostics from this hospitalization (including imaging, microbiology, ancillary and laboratory) are listed below for reference.   Imaging Studies: No results found.  Microbiology: Results for orders placed or performed during the hospital  encounter of 05/17/21  Resp Panel by RT-PCR (Flu A&B, Covid) Nasopharyngeal Swab     Status: None   Collection Time: 05/17/21  3:08 PM   Specimen: Nasopharyngeal Swab; Nasopharyngeal(NP) swabs in vial transport medium  Result Value Ref Range Status   SARS Coronavirus 2 by RT PCR NEGATIVE NEGATIVE Final    Comment: (NOTE) SARS-CoV-2 target nucleic acids are NOT DETECTED.  The SARS-CoV-2 RNA is generally detectable in upper respiratory specimens during the acute phase of infection. The lowest concentration of SARS-CoV-2 viral copies this assay can detect is 138 copies/mL. A negative result does not preclude SARS-Cov-2 infection and should not be used as the sole basis for treatment or other patient management decisions. A negative result may occur with  improper specimen collection/handling, submission of specimen other than nasopharyngeal swab, presence of viral mutation(s) within the areas targeted by this assay, and inadequate number of viral copies(<138  copies/mL). A negative result must be combined with clinical observations, patient history, and epidemiological information. The expected result is Negative.  Fact Sheet for Patients:  EntrepreneurPulse.com.au  Fact Sheet for Healthcare Providers:  IncredibleEmployment.be  This test is no t yet approved or cleared by the [place] FDA and  has been authorized for detection and/or diagnosis of SARS-CoV-2 by FDA under an Emergency Use Authorization (EUA). This EUA will remain  in effect (meaning this test can be used) for the duration of the COVID-19 declaration under Section 564(b)(1) of the Act, 21 U.S.C.section 360bbb-3(b)(1), unless the authorization is terminated  or revoked sooner.       Influenza A by PCR NEGATIVE NEGATIVE Final   Influenza B by PCR NEGATIVE NEGATIVE Final    Comment: (NOTE) The Xpert Xpress SARS-CoV-2/FLU/RSV plus assay is intended as an aid in the diagnosis of influenza from Nasopharyngeal swab specimens and should not be used as a sole basis for treatment. Nasal washings and aspirates are unacceptable for Xpert Xpress SARS-CoV-2/FLU/RSV testing.  Fact Sheet for Patients: EntrepreneurPulse.com.au  Fact Sheet for Healthcare Providers: IncredibleEmployment.be  This test is not yet approved or cleared by the [place] FDA and has been authorized for detection and/or diagnosis of SARS-CoV-2 by FDA under an Emergency Use Authorization (EUA). This EUA will remain in effect (meaning this test can be used) for the duration of the COVID-19 declaration under Section 564(b)(1) of the Act, 21 U.S.C. section 360bbb-3(b)(1), unless the authorization is terminated or revoked.  Performed at [facility], [place] [address] [place], [place] [address]   MRSA Next Gen by PCR, Nasal     Status: None   Collection Time: 05/17/21  5:24 PM   Specimen: Nasal Mucosa; Nasal Swab   Result Value Ref Range Status   MRSA by PCR Next Gen NOT DETECTED NOT DETECTED Final    Comment: (NOTE) The GeneXpert MRSA Assay (FDA approved for NASAL specimens only), is one component of a comprehensive MRSA colonization surveillance program. It is not intended to diagnose MRSA infection nor to guide or monitor treatment for MRSA infections. Test performance is not FDA approved in patients less than 52 years old. Performed at [facility], [place] [address], [place], [place] [address]     Labs: CBC: Recent Labs  Lab 05/17/21 1421 05/18/21 0606  WBC 12.7* 10.1  HGB 15.7 13.5  HCT 43.8 36.3*  MCV 83.1 81.8  PLT 191 824*   Basic Metabolic Panel: Recent Labs  Lab 05/17/21 1421 05/17/21 1747 05/17/21 2348 05/18/21 0606 05/19/21 0411  NA 123*  130* 133* 134* 132*  K 4.7 4.3 3.2* 3.2* 3.7  CL 84* 94* 100 102 102  CO2 17* 18* 23 23 20*  GLUCOSE 787* 402* 199* 197* 192*  BUN 33* 32* 28* 22* 17  CREATININE 2.06* 1.79* 1.38* 1.12 1.25*  CALCIUM 9.2 9.4 9.3 9.0 9.0  MG 2.4  --   --   --  1.9   Liver Function Tests: Recent Labs  Lab 05/17/21 1421  AST 16  ALT 27  ALKPHOS 168*  BILITOT 1.5*  PROT 8.3*  ALBUMIN 4.2   CBG: Recent Labs  Lab 05/18/21 1120 05/18/21 1652 05/18/21 2112 05/19/21 0739 05/19/21 1116  GLUCAP 191* 204* 266* 226* 285*    Discharge time spent: greater than 30 minutes.  Signed:  Berle Mull MD.  [facility]

## 2021-05-22 NOTE — Assessment & Plan Note (Signed)
Replaced. °

## 2021-05-22 NOTE — Assessment & Plan Note (Signed)
Mild. monitor

## 2021-05-22 NOTE — Assessment & Plan Note (Signed)
-   Continue home regimen 

## 2021-05-22 NOTE — Assessment & Plan Note (Signed)
See DKA 

## 2021-05-22 NOTE — Assessment & Plan Note (Signed)
Pt had mild redness that resolved. Recommended to follow up and establish care with podiatry.

## 2021-05-22 NOTE — Assessment & Plan Note (Signed)
Continue gabapentin.

## 2022-01-16 ENCOUNTER — Other Ambulatory Visit (HOSPITAL_COMMUNITY): Payer: Self-pay | Admitting: Orthopedic Surgery

## 2022-01-16 DIAGNOSIS — M25522 Pain in left elbow: Secondary | ICD-10-CM

## 2022-01-29 ENCOUNTER — Ambulatory Visit (HOSPITAL_COMMUNITY)
Admission: RE | Admit: 2022-01-29 | Discharge: 2022-01-29 | Disposition: A | Discharge status: Home / Self Care | Payer: No Typology Code available for payment source | Source: Ambulatory Visit | Attending: Orthopedic Surgery | Admitting: Orthopedic Surgery

## 2022-01-29 DIAGNOSIS — M25522 Pain in left elbow: Secondary | ICD-10-CM | POA: Diagnosis present

## 2023-09-26 ENCOUNTER — Other Ambulatory Visit: Payer: Self-pay | Admitting: Urology

## 2023-10-02 NOTE — Patient Instructions (Addendum)
 SURGICAL WAITING ROOM VISITATION  Patients having surgery or a procedure may have no more than 2 support people in the waiting area - these visitors may rotate.    Children under the age of 64 must have an adult with them who is not the patient.  Due to an increase in RSV and influenza rates and associated hospitalizations, children ages 29 and under may not visit patients in [facility] hospitals.  Visitors with respiratory illnesses are discouraged from visiting and should remain at home.  If the patient needs to stay at the hospital during part of their recovery, the visitor guidelines for inpatient rooms apply. Pre-op nurse will coordinate an appropriate time for 1 support person to accompany patient in pre-op.  This support person may not rotate.    Please refer to the [facility] website for the visitor guidelines for Inpatients (after your surgery is over and you are in a regular room).       Your procedure is scheduled on: 10/10/23   Report to [facility] Main Entrance    Report to admitting at 8:15 AM   Call this number if you have problems the morning of surgery [phone]   Do not eat food or drink liquids:After Midnight.but may have sips of water with meds.      Oral Hygiene is also important to reduce your risk of infection.                                    Remember - BRUSH YOUR TEETH THE MORNING OF SURGERY WITH YOUR REGULAR TOOTHPASTE  DENTURES WILL BE REMOVED PRIOR TO SURGERY PLEASE DO NOT APPLY "Poly grip" OR ADHESIVES!!!   Do NOT smoke after Midnight   Stop all vitamins and herbal supplements 7 days before surgery.   Take these medicines the morning of surgery with A SIP OF WATER: cetirizine(zyrtec), chlorthalidone(hygroton), proscar (finasteride ), gabapentin , lovastatin(mevacor), metoprolol , tamsulosin               Do not take Losartan (cozaar ) the morning of surgery  DO NOT TAKE ANY ORAL DIABETIC MEDICATIONS DAY OF YOUR SURGERY Hold Metformin  the  morning of surgery.  Hold Jardiance for 72 hours prior to surgery. Last dose to be 10/06/23                               You may not have any metal on your body including hair pins, jewelry, and body piercing             Do not wear make-up, lotions, powders, perfumes/cologne, or deodorant              Men may shave face and neck.   Do not bring valuables to the hospital.  IS NOT             RESPONSIBLE   FOR VALUABLES.   Contacts, glasses, dentures or bridgework may not be worn into surgery.   Bring small overnight bag day of surgery.   DO NOT BRING YOUR HOME MEDICATIONS TO THE HOSPITAL. PHARMACY WILL DISPENSE MEDICATIONS LISTED ON YOUR MEDICATION LIST TO YOU DURING YOUR ADMISSION IN THE HOSPITAL!    Patients discharged on the day of surgery will not be allowed to drive home.  Someone NEEDS to stay with you for the first 24 hours after anesthesia.   Special Instructions: Bring a copy of  your healthcare power of attorney and living will documents the day of surgery if you haven't scanned them before.              Please read over the following fact sheets you were given: IF YOU HAVE QUESTIONS ABOUT YOUR PRE-OP INSTRUCTIONS PLEASE CALL [phone] Ammon Bales   If you received a COVID test during your pre-op visit  it is requested that you wear a mask when out in public, stay away from anyone that may not be feeling well and notify your surgeon if you develop symptoms. If you test positive for Covid or have been in contact with anyone that has tested positive in the last 10 days please notify you surgeon.    [place] - Preparing for Surgery Before surgery, you can play an important role.  Because skin is not sterile, your skin needs to be as free of germs as possible.  You can reduce the number of germs on your skin by washing with CHG (chlorahexidine gluconate) soap before surgery.  CHG is an antiseptic cleaner which kills germs and bonds with the skin to continue killing  germs even after washing. Please DO NOT use if you have an allergy to CHG or antibacterial soaps.  If your skin becomes reddened/irritated stop using the CHG and inform your nurse when you arrive at Short Stay. Do not shave (including legs and underarms) for at least 48 hours prior to the first CHG shower.  You may shave your face/neck.  Please follow these instructions carefully:  1.  Shower with CHG Soap the night before surgery and the  morning of surgery.  2.  If you choose to wash your hair, wash your hair first as usual with your normal  shampoo.  3.  After you shampoo, rinse your hair and body thoroughly to remove the shampoo.                             4.  Use CHG as you would any other liquid soap.  You can apply chg directly to the skin and wash.  Gently with a scrungie or clean washcloth.  5.  Apply the CHG Soap to your body ONLY FROM THE NECK DOWN.   Do   not use on face/ open                           Wound or open sores. Avoid contact with eyes, ears mouth and   genitals (private parts).                       Wash face,  Genitals (private parts) with your normal soap.             6.  Wash thoroughly, paying special attention to the area where your    surgery  will be performed.  7.  Thoroughly rinse your body with warm water from the neck down.  8.  DO NOT shower/wash with your normal soap after using and rinsing off the CHG Soap.                9.  Pat yourself dry with a clean towel.            10.  Wear clean pajamas.            11.  Place clean sheets on your bed the night of  your first shower and do not  sleep with pets. Day of Surgery : Do not apply any lotions/deodorants the morning of surgery.  Please wear clean clothes to the hospital/surgery center.  FAILURE TO FOLLOW THESE INSTRUCTIONS MAY RESULT IN THE CANCELLATION OF YOUR SURGERY   How to Manage Your Diabetes Before and After Surgery  Why is it important to control my blood sugar before and after  surgery? Improving blood sugar levels before and after surgery helps healing and can limit problems. A way of improving blood sugar control is eating a healthy diet by:  Eating less sugar and carbohydrates  Increasing activity/exercise  Talking with your doctor about reaching your blood sugar goals High blood sugars (greater than 180 mg/dL) can raise your risk of infections and slow your recovery, so you will need to focus on controlling your diabetes during the weeks before surgery. Make sure that the doctor who takes care of your diabetes knows about your planned surgery including the date and location.  How do I manage my blood sugar before surgery? Check your blood sugar at least 4 times a day, starting 2 days before surgery, to make sure that the level is not too high or low. Check your blood sugar the morning of your surgery when you wake up and every 2 hours until you get to the Short Stay unit. If your blood sugar is less than 70 mg/dL, you will need to treat for low blood sugar: Do not take insulin . Treat a low blood sugar (less than 70 mg/dL) with  cup of clear juice (cranberry or apple), 4 glucose tablets, OR glucose gel. Recheck blood sugar in 15 minutes after treatment (to make sure it is greater than 70 mg/dL). If your blood sugar is not greater than 70 mg/dL on recheck, call [phone] for further instructions. Report your blood sugar to the short stay nurse when you get to Short Stay.  If you are admitted to the hospital after surgery: Your blood sugar will be checked by the staff and you will probably be given insulin  after surgery (instead of oral diabetes medicines) to make sure you have good blood sugar levels. The goal for blood sugar control after surgery is 80-180 mg/dL.   WHAT DO I DO ABOUT MY DIABETES MEDICATION?  Do not take oral diabetes medicines (pills) the morning of surgery.         THE MORNING OF SURGERY, take  only half of your regular insulin   dose.  DO NOT TAKE THE FOLLOWING 7 DAYS PRIOR TO SURGERY: Ozempic, Wegovy, Rybelsus (Semaglutide), Byetta (exenatide), Bydureon (exenatide ER), Victoza, Saxenda (liraglutide), or Trulicity (dulaglutide) Mounjaro (Tirzepatide) Adlyxin (Lixisenatide), Polyethylene Glycol Loxenatide.  For patients with insulin  pumps: Contact your diabetes doctor for specific instructions before surgery. Decrease basal rates by 20% at midnight the night before your surgery. Note that if your surgery is planned to be longer than 2 hours, your insulin  pump will be removed and intravenous (IV) insulin  will be started and managed by the nurses and the anesthesiologist. You will be able to restart your insulin  pump once you are awake and able to manage it.  Make sure to bring insulin  pump supplies to the hospital with you in case the  site needs to be changed.  Patient Signature:  Date:   Nurse Signature:  Date:

## 2023-10-02 NOTE — Progress Notes (Signed)
 COVID Vaccine received:  []  No [x]  Yes Date of any COVID positive Test in last 90 days: no PCP - Dr. Lonni Robert at [facility] Cardiologist - At [place]  Chest x-ray -  EKG -  10/04/23 Epic Stress Test -  ECHO -  CARDIAC CATH  Notes requested from [place] on 10/04/23.  Bowel Prep - [x]  No  []   Yes ______  Pacemaker / ICD device [x]  No []  Yes   Spinal Cord Stimulator:[x]  No []  Yes       History of Sleep Apnea? []  No [x]  Yes   CPAP used?- [x]  No []  Yes    Does the patient monitor blood sugar?          []  No [x]  Yes  []  N/A  Patient has: []  NO Hx DM   []  Pre-DM                 []  DM1  [x]   DM2 Does patient have a [employer] or Dexacom? []  No [x]  Yes   Fasting Blood Sugar Ranges- 80-90's Checks Blood Sugar __6___ times a day  GLP1 agonist / usual dose - Ozempic On Sundays Last dose 09/30/23 GLP1 instructions:  SGLT-2 inhibitors / usual dose - no SGLT-2 instructions:   Blood Thinner / Instructions:no Aspirin  Instructions:ASA 81 mg Instructed by MD to stop 7 days PTS.  Comments:   Activity level: Patient is able to climb a flight of stairs without difficulty; [x]  No CP  [x]  No SOB, but would have ___   Patient can  perform ADLs without assistance.   Anesthesia review: HTN, COPD, DM  Patient denies shortness of breath, fever, cough and chest pain at PAT appointment.  Patient verbalized understanding and agreement to the Pre-Surgical Instructions that were given to them at this PAT appointment. Patient was also educated of the need to review these PAT instructions again prior to his/her surgery.I reviewed the appropriate phone numbers to call if they have any and questions or concerns.

## 2023-10-04 ENCOUNTER — Encounter (HOSPITAL_COMMUNITY)
Admission: RE | Admit: 2023-10-04 | Discharge: 2023-10-04 | Disposition: A | Discharge status: Home / Self Care | Source: Ambulatory Visit | Attending: Urology | Admitting: Urology

## 2023-10-04 ENCOUNTER — Other Ambulatory Visit: Payer: Self-pay

## 2023-10-04 ENCOUNTER — Encounter (HOSPITAL_COMMUNITY): Payer: Self-pay

## 2023-10-04 VITALS — BP 135/90 | HR 81 | Temp 98.3°F | Resp 18 | Ht 71.0 in | Wt 240.0 lb

## 2023-10-04 DIAGNOSIS — Z01818 Encounter for other preprocedural examination: Secondary | ICD-10-CM | POA: Insufficient documentation

## 2023-10-04 DIAGNOSIS — Z794 Long term (current) use of insulin: Secondary | ICD-10-CM | POA: Insufficient documentation

## 2023-10-04 DIAGNOSIS — E119 Type 2 diabetes mellitus without complications: Secondary | ICD-10-CM | POA: Insufficient documentation

## 2023-10-04 HISTORY — DX: Sleep apnea, unspecified: G47.30

## 2023-10-04 HISTORY — DX: Headache, unspecified: R51.9

## 2023-10-04 HISTORY — DX: Unspecified asthma, uncomplicated: J45.909

## 2023-10-04 HISTORY — DX: Unspecified osteoarthritis, unspecified site: M19.90

## 2023-10-04 HISTORY — DX: Myoneural disorder, unspecified: G70.9

## 2023-10-04 LAB — CBC
HCT: 48.3 % (ref 39.0–52.0)
Hemoglobin: 17.4 g/dL — ABNORMAL HIGH (ref 13.0–17.0)
MCH: 30.9 pg (ref 26.0–34.0)
MCHC: 36 g/dL (ref 30.0–36.0)
MCV: 85.8 fL (ref 80.0–100.0)
Platelets: 174 10*3/uL (ref 150–400)
RBC: 5.63 MIL/uL (ref 4.22–5.81)
RDW: 12.9 % (ref 11.5–15.5)
WBC: 8.4 10*3/uL (ref 4.0–10.5)
nRBC: 0 % (ref 0.0–0.2)

## 2023-10-04 LAB — HEMOGLOBIN A1C
Hgb A1c MFr Bld: 5.4 % (ref 4.8–5.6)
Mean Plasma Glucose: 108.28 mg/dL

## 2023-10-04 LAB — BASIC METABOLIC PANEL WITH GFR
Anion gap: 8 (ref 5–15)
BUN: 20 mg/dL (ref 8–23)
CO2: 23 mmol/L (ref 22–32)
Calcium: 9.4 mg/dL (ref 8.9–10.3)
Chloride: 106 mmol/L (ref 98–111)
Creatinine, Ser: 1.58 mg/dL — ABNORMAL HIGH (ref 0.61–1.24)
GFR, Estimated: 49 mL/min — ABNORMAL LOW (ref >60)
Glucose, Bld: 104 mg/dL — ABNORMAL HIGH (ref 70–99)
Potassium: 3.4 mmol/L — ABNORMAL LOW (ref 3.5–5.1)
Sodium: 137 mmol/L (ref 135–145)

## 2023-10-09 ENCOUNTER — Encounter (INDEPENDENT_AMBULATORY_CARE_PROVIDER_SITE_OTHER): Payer: Self-pay | Admitting: Otolaryngology

## 2023-10-09 ENCOUNTER — Ambulatory Visit (INDEPENDENT_AMBULATORY_CARE_PROVIDER_SITE_OTHER): Admitting: Otolaryngology

## 2023-10-09 VITALS — BP 122/79 | HR 83

## 2023-10-09 DIAGNOSIS — R0981 Nasal congestion: Secondary | ICD-10-CM | POA: Diagnosis not present

## 2023-10-09 DIAGNOSIS — H6123 Impacted cerumen, bilateral: Secondary | ICD-10-CM | POA: Diagnosis not present

## 2023-10-09 DIAGNOSIS — J310 Chronic rhinitis: Secondary | ICD-10-CM | POA: Diagnosis not present

## 2023-10-09 DIAGNOSIS — R0982 Postnasal drip: Secondary | ICD-10-CM

## 2023-10-09 DIAGNOSIS — J343 Hypertrophy of nasal turbinates: Secondary | ICD-10-CM | POA: Diagnosis not present

## 2023-10-09 MED ORDER — IPRATROPIUM BROMIDE 0.06 % NA SOLN: 2.0000 | Freq: Two times a day (BID) | NASAL | 12 refills | Status: AC | PRN

## 2023-10-09 NOTE — Anesthesia Preprocedure Evaluation (Addendum)
 Anesthesia Evaluation  Patient identified by MRN, date of birth, ID band Patient awake    Reviewed: Allergy & Precautions, NPO status , Patient's Chart, lab work & pertinent test results  History of Anesthesia Complications Negative for: history of anesthetic complications  Airway Mallampati: III  TM Distance: >3 FB Neck ROM: Full   Comment: Left side of the mouth is able to smile, the right side is not. The patient reports a h/o Bell's palsy. Dental  (+) Dental Advisory Given   Pulmonary neg shortness of breath, asthma , sleep apnea (does not tolerate CPAP) , COPD (no recent flares),  COPD inhaler, neg recent URI   Pulmonary exam normal breath sounds clear to auscultation       Cardiovascular hypertension (chlorthalidone, losartan , metoprolol ), Pt. on medications and Pt. on home beta blockers (-) angina (-) Past MI, (-) Cardiac Stents and (-) CABG (-) dysrhythmias  Rhythm:Regular Rate:Normal  HLD   Neuro/Psych  Headaches, neg Seizures  Neuromuscular disease (neuropathy)    GI/Hepatic negative GI ROS, Neg liver ROS,,,  Endo/Other  diabetes, Type 2, Insulin  Dependent, Oral Hypoglycemic Agents    Renal/GU negative Renal ROS   BPH    Musculoskeletal  (+) Arthritis ,    Abdominal  (+) + obese  Peds  Hematology negative hematology ROS (+) Lab Results      Component                Value               Date                      WBC                      8.4                 10/04/2023                HGB                      17.4 (H)            10/04/2023                HCT                      48.3                10/04/2023                MCV                      85.8                10/04/2023                PLT                      174                 10/04/2023              Anesthesia Other Findings Last Jardiance: 10/06/2023  Last Ozempic: 2 weeks ago  Reproductive/Obstetrics                              Anesthesia Physical Anesthesia Plan  ASA: 3  Anesthesia Plan: General   Post-op Pain Management: Tylenol  PO (pre-op)*   Induction: Intravenous  PONV Risk Score and Plan: 1 and Ondansetron , Dexamethasone and Treatment may vary due to age or medical condition  Airway Management Planned: Oral ETT  Additional Equipment:   Intra-op Plan:   Post-operative Plan: Extubation in OR  Informed Consent: I have reviewed the patients History and Physical, chart, labs and discussed the procedure including the risks, benefits and alternatives for the proposed anesthesia with the patient or authorized representative who has indicated his/her understanding and acceptance.     Dental advisory given  Plan Discussed with: CRNA and Anesthesiologist  Anesthesia Plan Comments: (Risks of general anesthesia discussed including, but not limited to, sore throat, hoarse voice, chipped/damaged teeth, injury to vocal cords, nausea and vomiting, allergic reactions, lung infection, heart attack, stroke, and death. All questions answered. )       Anesthesia Quick Evaluation

## 2023-10-10 ENCOUNTER — Ambulatory Visit (HOSPITAL_BASED_OUTPATIENT_CLINIC_OR_DEPARTMENT_OTHER): Admitting: Anesthesiology

## 2023-10-10 ENCOUNTER — Encounter (HOSPITAL_COMMUNITY)
Admission: RE | Disposition: A | Discharge status: Home / Self Care | Payer: Self-pay | Source: Ambulatory Visit | Attending: Urology

## 2023-10-10 ENCOUNTER — Ambulatory Visit (HOSPITAL_COMMUNITY)
Admission: RE | Admit: 2023-10-10 | Discharge: 2023-10-10 | Disposition: A | Discharge status: Home / Self Care | Source: Ambulatory Visit | Attending: Urology | Admitting: Urology

## 2023-10-10 ENCOUNTER — Ambulatory Visit (HOSPITAL_COMMUNITY): Admitting: Anesthesiology

## 2023-10-10 ENCOUNTER — Encounter (HOSPITAL_COMMUNITY): Payer: Self-pay | Admitting: Urology

## 2023-10-10 DIAGNOSIS — Z79899 Other long term (current) drug therapy: Secondary | ICD-10-CM | POA: Insufficient documentation

## 2023-10-10 DIAGNOSIS — J4489 Other specified chronic obstructive pulmonary disease: Secondary | ICD-10-CM | POA: Insufficient documentation

## 2023-10-10 DIAGNOSIS — E1140 Type 2 diabetes mellitus with diabetic neuropathy, unspecified: Secondary | ICD-10-CM | POA: Insufficient documentation

## 2023-10-10 DIAGNOSIS — H6123 Impacted cerumen, bilateral: Secondary | ICD-10-CM | POA: Insufficient documentation

## 2023-10-10 DIAGNOSIS — J343 Hypertrophy of nasal turbinates: Secondary | ICD-10-CM | POA: Insufficient documentation

## 2023-10-10 DIAGNOSIS — R3912 Poor urinary stream: Secondary | ICD-10-CM | POA: Diagnosis not present

## 2023-10-10 DIAGNOSIS — J310 Chronic rhinitis: Secondary | ICD-10-CM | POA: Insufficient documentation

## 2023-10-10 DIAGNOSIS — E785 Hyperlipidemia, unspecified: Secondary | ICD-10-CM | POA: Diagnosis not present

## 2023-10-10 DIAGNOSIS — R3915 Urgency of urination: Secondary | ICD-10-CM | POA: Insufficient documentation

## 2023-10-10 DIAGNOSIS — N401 Enlarged prostate with lower urinary tract symptoms: Secondary | ICD-10-CM | POA: Insufficient documentation

## 2023-10-10 DIAGNOSIS — R351 Nocturia: Secondary | ICD-10-CM | POA: Insufficient documentation

## 2023-10-10 DIAGNOSIS — J449 Chronic obstructive pulmonary disease, unspecified: Secondary | ICD-10-CM | POA: Diagnosis not present

## 2023-10-10 DIAGNOSIS — R0982 Postnasal drip: Secondary | ICD-10-CM | POA: Insufficient documentation

## 2023-10-10 DIAGNOSIS — R350 Frequency of micturition: Secondary | ICD-10-CM | POA: Insufficient documentation

## 2023-10-10 DIAGNOSIS — I10 Essential (primary) hypertension: Secondary | ICD-10-CM | POA: Diagnosis not present

## 2023-10-10 DIAGNOSIS — Z794 Long term (current) use of insulin: Secondary | ICD-10-CM | POA: Diagnosis not present

## 2023-10-10 LAB — GLUCOSE, CAPILLARY
Glucose-Capillary: 111 mg/dL — ABNORMAL HIGH (ref 70–99)
Glucose-Capillary: 158 mg/dL — ABNORMAL HIGH (ref 70–99)

## 2023-10-10 LAB — TYPE AND SCREEN
ABO/RH(D): O POS
Antibody Screen: NEGATIVE

## 2023-10-10 LAB — ABO/RH: ABO/RH(D): O POS

## 2023-10-10 SURGERY — ABLATION, PROSTATE, TRANSURETHRAL, USING WATERJET
Anesthesia: General

## 2023-10-10 MED ORDER — INSULIN ASPART 100 UNIT/ML IJ SOLN: 0.0000 [IU] | INTRAMUSCULAR | Status: DC | PRN

## 2023-10-10 MED ORDER — ALBUTEROL SULFATE HFA 108 (90 BASE) MCG/ACT IN AERS
INHALATION_SPRAY | RESPIRATORY_TRACT | Status: DC | PRN
  Administered 2023-10-10: 4 via RESPIRATORY_TRACT

## 2023-10-10 MED ORDER — PROPOFOL 10 MG/ML IV BOLUS
INTRAVENOUS | Status: DC | PRN
  Administered 2023-10-10: 200 mg via INTRAVENOUS
  Administered 2023-10-10: 40 mg via INTRAVENOUS

## 2023-10-10 MED ORDER — LACTATED RINGERS IV SOLN: INTRAVENOUS | Status: DC

## 2023-10-10 MED ORDER — ACETAMINOPHEN 500 MG PO TABS
1000.0000 mg | ORAL_TABLET | Freq: Once | ORAL | Status: AC
  Administered 2023-10-10: 1000 mg via ORAL
  Filled 2023-10-10: qty 2

## 2023-10-10 MED ORDER — CHLORHEXIDINE GLUCONATE 0.12 % MT SOLN
15.0000 mL | Freq: Once | OROMUCOSAL | Status: AC
  Administered 2023-10-10: 15 mL via OROMUCOSAL

## 2023-10-10 MED ORDER — ORAL CARE MOUTH RINSE: 15.0000 mL | Freq: Once | OROMUCOSAL | Status: AC

## 2023-10-10 MED ORDER — SUGAMMADEX SODIUM 200 MG/2ML IV SOLN
INTRAVENOUS | Status: DC | PRN
  Administered 2023-10-10: 400 mg via INTRAVENOUS
  Administered 2023-10-10: 100 mg via INTRAVENOUS

## 2023-10-10 MED ORDER — ROCURONIUM BROMIDE 10 MG/ML (PF) SYRINGE
PREFILLED_SYRINGE | INTRAVENOUS | Status: DC | PRN
  Administered 2023-10-10: 60 mg via INTRAVENOUS

## 2023-10-10 MED ORDER — SODIUM CHLORIDE 0.9 % IR SOLN
Status: DC | PRN
  Administered 2023-10-10: 12000 mL via INTRAVESICAL

## 2023-10-10 MED ORDER — HYDROCODONE-ACETAMINOPHEN 5-325 MG PO TABS: 1.0000 | ORAL_TABLET | Freq: Four times a day (QID) | ORAL | 0 refills | Status: AC | PRN

## 2023-10-10 MED ORDER — FENTANYL CITRATE (PF) 100 MCG/2ML IJ SOLN
INTRAMUSCULAR | Status: DC | PRN
  Administered 2023-10-10: 100 ug via INTRAVENOUS

## 2023-10-10 MED ORDER — LIDOCAINE HCL (PF) 2 % IJ SOLN
INTRAMUSCULAR | Status: DC | PRN
  Administered 2023-10-10: 100 mg via INTRADERMAL

## 2023-10-10 MED ORDER — OXYCODONE HCL 5 MG PO TABS: 5.0000 mg | ORAL_TABLET | Freq: Once | ORAL | Status: DC | PRN

## 2023-10-10 MED ORDER — ONDANSETRON HCL 4 MG/2ML IJ SOLN
INTRAMUSCULAR | Status: AC
  Filled 2023-10-10: qty 2

## 2023-10-10 MED ORDER — FENTANYL CITRATE (PF) 100 MCG/2ML IJ SOLN
INTRAMUSCULAR | Status: AC
  Filled 2023-10-10: qty 2

## 2023-10-10 MED ORDER — AMOXICILLIN-POT CLAVULANATE 875-125 MG PO TABS: 1.0000 | ORAL_TABLET | Freq: Two times a day (BID) | ORAL | 0 refills | Status: AC

## 2023-10-10 MED ORDER — ALBUTEROL SULFATE HFA 108 (90 BASE) MCG/ACT IN AERS
INHALATION_SPRAY | RESPIRATORY_TRACT | Status: AC
  Filled 2023-10-10: qty 6.7

## 2023-10-10 MED ORDER — PROPOFOL 10 MG/ML IV BOLUS
INTRAVENOUS | Status: AC
  Filled 2023-10-10: qty 20

## 2023-10-10 MED ORDER — OXYCODONE HCL 5 MG/5ML PO SOLN: 5.0000 mg | Freq: Once | ORAL | Status: DC | PRN

## 2023-10-10 MED ORDER — SODIUM CHLORIDE 0.9 % IV SOLN
2.0000 g | INTRAVENOUS | Status: AC
  Administered 2023-10-10: 2 g via INTRAVENOUS
  Filled 2023-10-10: qty 20

## 2023-10-10 MED ORDER — FENTANYL CITRATE PF 50 MCG/ML IJ SOSY
25.0000 ug | PREFILLED_SYRINGE | INTRAMUSCULAR | Status: DC | PRN
Start: 2023-10-10 — End: 2023-10-10

## 2023-10-10 MED ORDER — PHENYLEPHRINE 80 MCG/ML (10ML) SYRINGE FOR IV PUSH (FOR BLOOD PRESSURE SUPPORT)
PREFILLED_SYRINGE | INTRAVENOUS | Status: AC
  Filled 2023-10-10: qty 10

## 2023-10-10 MED ORDER — PHENYLEPHRINE HCL-NACL 20-0.9 MG/250ML-% IV SOLN
INTRAVENOUS | Status: DC | PRN
  Administered 2023-10-10: 160 ug via INTRAVENOUS
  Administered 2023-10-10: 80 ug via INTRAVENOUS

## 2023-10-10 MED ORDER — LIDOCAINE HCL (PF) 2 % IJ SOLN
INTRAMUSCULAR | Status: AC
  Filled 2023-10-10: qty 5

## 2023-10-10 MED ORDER — ONDANSETRON HCL 4 MG/2ML IJ SOLN
INTRAMUSCULAR | Status: DC | PRN
  Administered 2023-10-10: 4 mg via INTRAVENOUS

## 2023-10-10 MED ORDER — DEXAMETHASONE SODIUM PHOSPHATE 10 MG/ML IJ SOLN
INTRAMUSCULAR | Status: DC | PRN
Start: 2023-10-10 — End: 2023-10-10
  Administered 2023-10-10: 5 mg via INTRAVENOUS

## 2023-10-10 MED ORDER — TRANEXAMIC ACID-NACL 1000-0.7 MG/100ML-% IV SOLN
1000.0000 mg | INTRAVENOUS | Status: AC
  Administered 2023-10-10: 1000 mg via INTRAVENOUS
  Filled 2023-10-10: qty 100

## 2023-10-10 MED ORDER — ROCURONIUM BROMIDE 10 MG/ML (PF) SYRINGE
PREFILLED_SYRINGE | INTRAVENOUS | Status: AC
  Filled 2023-10-10: qty 10

## 2023-10-10 MED ORDER — DEXAMETHASONE SODIUM PHOSPHATE 10 MG/ML IJ SOLN
INTRAMUSCULAR | Status: AC
  Filled 2023-10-10: qty 1

## 2023-10-10 MED ORDER — AMISULPRIDE (ANTIEMETIC) 5 MG/2ML IV SOLN: 10.0000 mg | Freq: Once | INTRAVENOUS | Status: DC | PRN

## 2023-10-10 MED ORDER — 0.9 % SODIUM CHLORIDE (POUR BTL) OPTIME
TOPICAL | Status: DC | PRN
  Administered 2023-10-10: 1000 mL

## 2023-10-10 SURGICAL SUPPLY — 24 items
BAG URINE DRAIN 2000ML AR STRL (UROLOGICAL SUPPLIES) ×1 IMPLANT
CANISTER SUCT 3000ML PPV (MISCELLANEOUS) ×1 IMPLANT
CATH HEMA 3WAY 30CC 22FR COUDE (CATHETERS) IMPLANT
CATH HEMA 3WAY 30CC 24FR COUDE (CATHETERS) IMPLANT
COVER MAYO STAND STRL (DRAPES) ×1 IMPLANT
DRAPE FOOT SWITCH (DRAPES) ×1 IMPLANT
GEL ULTRASOUND 8.5O AQUASONIC (MISCELLANEOUS) ×1 IMPLANT
GLOVE BIO SURGEON STRL SZ7.5 (GLOVE) ×1 IMPLANT
GOWN STRL REUS W/ TWL XL LVL3 (GOWN DISPOSABLE) ×1 IMPLANT
HANDPIECE AQUABEAM (MISCELLANEOUS) ×1 IMPLANT
HOLDER FOLEY CATH W/STRAP (MISCELLANEOUS) IMPLANT
KIT TURNOVER KIT A (KITS) IMPLANT
LOOP CUT BIPOLAR 24F LRG (ELECTROSURGICAL) ×1 IMPLANT
MANIFOLD NEPTUNE II (INSTRUMENTS) ×1 IMPLANT
MAT ABSORB FLUID 56X50 GRAY (MISCELLANEOUS) ×1 IMPLANT
PACK CYSTO (CUSTOM PROCEDURE TRAY) ×1 IMPLANT
PACK DRAPE AQUABEAM (MISCELLANEOUS) ×1 IMPLANT
PAD PREP 24X48 CUFFED NSTRL (MISCELLANEOUS) ×1 IMPLANT
SYR 30ML LL (SYRINGE) ×1 IMPLANT
SYRINGE TOOMEY IRRIG 70ML (MISCELLANEOUS) ×2 IMPLANT
TOWEL OR 17X26 10 PK STRL BLUE (TOWEL DISPOSABLE) ×1 IMPLANT
TUBING CONNECTING 10 (TUBING) ×2 IMPLANT
TUBING UROLOGY SET (TUBING) ×1 IMPLANT
UNDERPAD 30X36 HEAVY ABSORB (UNDERPADS AND DIAPERS) ×1 IMPLANT

## 2023-10-10 NOTE — Progress Notes (Signed)
 CC: Chronic postnasal drainage, chronic nasal congestion  HPI:  Peter Zamora is a 63 y.o. male who presents today complaining of chronic postnasal drainage for 5 to 6 years.  He also reports occasional nasal congestion and sinusitis.  His last antibiotic was 2 weeks ago.  He has a history of environmental allergies.  He uses Zyrtec as needed.  He has no previous ENT surgery.  Currently he denies any significant facial pain, fever, or visual change.  Past Medical History:  Diagnosis Date   Arthritis    Asthma    Chronic obstructive pulmonary disease (HCC) 01/20/2018   Headache    Hyperlipidemia    Hypertension    Neuromuscular disorder (HCC)    Sleep apnea     Past Surgical History:  Procedure Laterality Date   ANKLE SURGERY Right    HERNIA REPAIR  07/06/2020   umbilical [facility], [facility] [place]   KNEE ARTHROPLASTY Left    tka Left     Family History  Problem Relation Age of Onset   Diabetes Mother    Heart attack Father     Social History:  reports that he has never smoked. He does not have any smokeless tobacco history on file. He reports that he does not currently use alcohol. He reports that he does not currently use drugs.  Allergies:  Allergies  Allergen Reactions   Diclofenac     Other reaction(s): Itching of eye   Latex Rash and Hives   Melatonin Rash    Prior to Admission medications   Medication Sig Start Date End Date Taking? Authorizing Provider  albuterol (VENTOLIN HFA) 108 (90 Base) MCG/ACT inhaler Inhale 1-2 puffs into the lungs every 6 (six) hours as needed for wheezing or shortness of breath. 09/05/19  Yes [provider]  aspirin  81 MG EC tablet Take 81 mg by mouth daily. 05/17/21  Yes [provider]  cetirizine (ZYRTEC) 10 MG tablet Take 10 mg by mouth daily. 02/01/21  Yes [provider]  chlorthalidone (HYGROTON) 25 MG tablet Take 12.5 mg by mouth daily.   Yes [provider]  empagliflozin  (JARDIANCE) 25 MG TABS tablet Take 12.5 mg by mouth daily.   Yes [provider]  finasteride  (PROSCAR ) 5 MG tablet Take 5 mg by mouth daily. 11/19/20  Yes [provider]  fluticasone-salmeterol (ADVAIR) 250-50 MCG/ACT AEPB Inhale 1 puff into the lungs in the morning and at bedtime. 08/21/23  Yes [provider]  gabapentin  (NEURONTIN ) 300 MG capsule Take 600 mg by mouth 2 (two) times daily. 10/05/20  Yes [provider]  insulin  glargine (LANTUS ) 100 UNIT/ML Solostar Pen Inject 28 Units into the skin daily. Patient taking differently: Inject 14 Units into the skin daily. 05/19/21  Yes Patel, Pranav M, MD  ipratropium (ATROVENT) 0.06 % nasal spray Place 2 sprays into both nostrils 2 (two) times daily as needed (nasal drainage). 10/09/23 11/08/23 Yes Reynold Caves, MD  losartan  (COZAAR ) 100 MG tablet Take 100 mg by mouth daily. 02/01/21  Yes [provider]  lovastatin (MEVACOR) 40 MG tablet Take 40 mg by mouth at bedtime. 07/06/23  Yes [provider]  Magnesium Oxide 420 MG TABS Take 420 mg by mouth daily. 02/01/21  Yes [provider]  metoprolol  tartrate (LOPRESSOR ) 50 MG tablet Take 50 mg by mouth daily. 02/01/21  Yes [provider]  mirtazapine  (REMERON ) 30 MG tablet Take 30 mg by mouth at bedtime. 04/12/21  Yes [provider]  Multiple Vitamins-Minerals (MULTIVITAMIN ADULT, MINERALS,) TABS Take 1 tablet by mouth daily. 02/01/21  Yes [provider]  ondansetron  (ZOFRAN ) 8 MG tablet Take 4 mg by mouth 3 (three) times daily as needed for nausea. 05/17/21  Yes [provider]  oxybutynin (DITROPAN-XL) 5 MG 24 hr tablet Take 5 mg by mouth daily as needed (overreactive bladder). 11/19/20  Yes [provider]  Polyethyl Glycol-Propyl Glycol (SYSTANE) 0.4-0.3 % SOLN Place 1 drop into both eyes 4 (four) times daily as needed (dry eyes).   Yes [provider]  QUEtiapine  (SEROQUEL ) 200 MG tablet Take 200  mg by mouth at bedtime. 01/04/21  Yes [provider]  Semaglutide (OZEMPIC, 1 MG/DOSE, [place]) Inject 1 mg into the skin every 7 (seven) days.   Yes [provider]  tamsulosin  (FLOMAX ) 0.4 MG CAPS capsule Take 0.4 mg by mouth daily. 11/19/20  Yes [provider]  metFORMIN  (GLUCOPHAGE ) 500 MG tablet Take 1 tablet (500 mg total) by mouth daily with breakfast. 05/19/21 09/27/23  Kraig Peru, MD    Blood pressure 122/79, pulse 83, SpO2 (!) 88%. Exam: General: Communicates without difficulty, well nourished, no acute distress. Head: Normocephalic, no evidence injury, no tenderness, facial buttresses intact without stepoff. Face/sinus: No tenderness to palpation and percussion. Facial movement is normal and symmetric. Eyes: PERRL, EOMI. No scleral icterus, conjunctivae clear. Neuro: CN II exam reveals vision grossly intact.  No nystagmus at any point of gaze. Ears: Auricles well formed without lesions.  Bilateral cerumen impaction.  Nose: External evaluation reveals normal support and skin without lesions.  Dorsum is intact.  Anterior rhinoscopy reveals congested mucosa over anterior aspect of inferior turbinates and intact septum.  No purulence noted. Oral:  Oral cavity and oropharynx are intact, symmetric, without erythema or edema.  Mucosa is moist without lesions. Neck: Full range of motion without pain.  There is no significant lymphadenopathy.  No masses palpable.  Thyroid bed within normal limits to palpation.  Parotid glands and submandibular glands equal bilaterally without mass.  Trachea is midline. Neuro:  CN 2-12 grossly intact.   Procedure:  Flexible Nasal Endoscopy: Description: Risks, benefits, and alternatives of flexible endoscopy were explained to the patient.  Specific mention was made of the risk of throat numbness with difficulty swallowing, possible bleeding from the nose and mouth, and pain from the procedure.  The patient gave oral consent to proceed.  The  flexible scope was inserted into the right nasal cavity.  Endoscopy of the interior nasal cavity, superior, inferior, and middle meatus was performed. The sphenoid-ethmoid recess was examined. Edematous mucosa was noted.  No polyp, mass, or lesion was appreciated.  Olfactory cleft was clear.  Nasopharynx was clear.  Turbinates were hypertrophied but without mass.  The procedure was repeated on the contralateral side with similar findings.  The patient tolerated the procedure well.  Procedure: Bilateral cerumen disimpaction Anesthesia: None Description: Under the operating microscope, the cerumen is carefully removed with a combination of cerumen currette, alligator forceps, and suction catheters.  The right ear canal is completely disimpacted.  A small amount of residual cerumen is noted within the left ear canal.  The patient cannot tolerate the complete disimpaction due to his discomfort.  No mass, erythema, or lesions.   Assessment: 1.  Chronic rhinitis with nasal mucosal congestion and bilateral inferior turbinate hypertrophy. 2.  Chronic postnasal drainage. 3.  Bilateral dense cerumen impaction.  Plan: 1.  Otomicroscopy with bilateral cerumen disimpaction.  A small amount  of residual cerumen is noted within the left ear canal.  Both tympanic membranes and middle ear spaces are noted to be normal. 2.  The physical exam and nasal endoscopy findings are reviewed with the patient. 3.  Atrovent nasal spray to treat his chronic postnasal drainage. 4.  Nasal saline irrigation is encouraged. 5.  Debrox eardrops for 1 week prior to his next appointment. 6.  The patient will return for reevaluation in 6 weeks.  Daelen Belvedere W Laithan Conchas 10/10/2023, 8:37 AM

## 2023-10-10 NOTE — Op Note (Signed)
 Preoperative diagnosis: BPH with lower urinary tract symptoms, weak stream, frequency  Postoperative diagnosis: Same   Procedure: Robotic water jet ablation of the prostate   Surgeon: Leila Punt, MD  Anesthesia: General   Indication for procedure:   Findings:  Cystoscopy revealed bilobar hypertrophy with a small median lobe  Description of procedure:  He was brought to the operating room and placed supine on the operating table.  After adequate anesthesia he was placed lithotomy position. Timeout was performed to confirm the patient and procedure. The TRUS Stepper was mounted to the Articulating Arm and secured to OR bed. The ultrasound probe was attached to the stepper. Exam under anesthesia was performed and the TRUS was inserted per rectum.  There was no resistance. The ultrasound probe was aligned, and confirmation made that the prostate is centered and aligned using both transverse and sagittal views. The bladder neck, verumontanum and the central/transition zones were identified.  Genitalia were prepped and draped in the usual sterile fashion. The 38F AQUABEAM Handpiece is inserted into the prostatic urethra and a complete cystoscopic evaluation was performed by inspecting the prostate, bladder, and identifying the location of the verumontanum/external sphincter. The AQUABEAM Handpiece was secured to the Handpiece Articulating Arm. Confirmed alignment of AQUABEAM Handpiece and TRUS Probe to be parallel and colinear. Confirmation that AQUABEAM nozzle is centered and anterior of the bladder neck or the median lobe. The cystoscope was then retracted to visualize the verumontanum and external sphincter and the cystoscope tip was positioned just proximal to the external sphincter. Reconfirmed alignment of the TRUS probe with the AQUABEAM Handpiece and compression applied with TRUS probe. Horizontal alignment of the Handpiece waterjet nozzle was performed. The Aquablation treatment zones were planned  utilizing real-time TRUS to visualize the contour of the prostate and the depth and radial angles of resection were defined in the transverse view. In the sagittal view, the AQUABEAM nozzle is identified and position registered with software. The treatment contours were then adjusted to conform to the intended resection margins. The median lobe, bladder neck and verumontanum were marked and confirmed in the treatment contour. The Aquablation Treatment was then started following the resection contour confirmed under ultrasound guidance.  2 resection treatments were performed.  Once Aquablation resection was complete the 24 French aqua beam handpiece was carefully removed.  The continuous-flow sheath with the visual obturator was passed and then the loop and handle.  The trigone and the ureteral orifices were identified.  Resection of some of the residual median lobe and bladder neck tissue was done.  The bladder neck was identified at 6:00 and this was taken up to 12:00 with fulguration of the bladder neck and prostate for hemostasis.  Similarly from 6:00 up to 12:00 on the left side of the bladder neck was identified by resecting some of the ablated tissue to identify the bladder neck and cauterize any bleeding.  Some anterior tissue was resected.  This created excellent hemostasis.  All the chips were evacuated.  Ureteral orifices again identified and noted to be normal without injury.  The scope was backed out and a 24 [place] hematuria catheter was placed with 30 cc in the balloon.  The balloon was seated at the bladder neck and it was irrigated on light traction and noted to be clear to pink.  He was hooked up to CBI.  He was cleaned up and placed supine.  Catheter was placed on traction.  He was awakened and taken to the cover room in stable condition.  Complications: None  Blood loss: 100 mL  Specimens: None  Drains: 24 [place] three-way hematuria catheter with 30 cc in the balloon  Disposition:  Patient stable to PACU

## 2023-10-10 NOTE — H&P (Signed)
 CC/HPI: CC: Lower urinary tract symptoms  HPI:  08/07/2023  63 year old male with history of BPH with lower urinary tract symptoms currently on finasteride  and Flomax  0.8 mg daily. Despite this, he has persistent voiding complaints including incomplete bladder emptying with a PVR of 218 cc, frequency, intermittency, urgency, mildly weak stream, nocturia x 2. He denies any hematuria or dysuria. He does have a history of elevated PSA as high as 6.8 in the past for which he underwent negative prostate biopsy in 2016. Had negative MRI in 2017. Last PSA was February 2024 and was under 2. He denies family history of prostate cancer.   09/25/2023  Patient underwent urodynamics that revealed the following:   UDS SUMMARY  Peter Zamora held a max capacity of approx. 421 mls. His 1st sensation was felt at 71 mls. No instability was noted. He was able to generate a voluntary contraction and void 101 ml with max flow of 8 ml/s. max detrusor pressure while voiding was 126 cmH20. Straining was noted. Urinary flow obstructed. EMG leads were basically quiet during the voiding phase. PVR was approx. 318 mls. Some elevation of the bladder base was noted. No reflux was seen. He will return for UDS follow up.   Prostate size by ultrasound was 88.42 g. PSA was 1.57 on finasteride .     ALLERGIES: No Allergies    MEDICATIONS: Aspirin   Finasteride   Metoprolol  Tartrate  Tamsulosin  HCl 0.4 MG Capsule  Albuterol Sulfate  Azelastine Hcl  Cetirizine Hcl  Chlorthalidone  Fluticasone Propionate  Gabapentin   Glucose  Guaifenesin  Insulin   Lovastatin  Mirtazapine   Montelukast Sodium  Multi Vitamin  Quetiapine  Fumarate     GU PSH: Complex cystometrogram, w/ void pressure and urethral pressure profile studies, any technique - 08/27/2023 Complex Uroflow - 08/27/2023 Emg surf Electrd - 08/27/2023 Inject For cystogram - 08/27/2023 Intrabd voidng Press - 08/27/2023     NON-GU PSH: Anesth, Lower Arm Procedure, Left Hand/finger  Surgery, Left Knee replacement, Left Visit Complexity (formerly GPC1X) - 08/07/2023         GU PMH: Incomplete bladder emptying - 08/27/2023, - 08/07/2023 Urinary Frequency - 08/27/2023, - 08/07/2023 Urinary Urgency - 08/27/2023, - 08/07/2023 BPH w/LUTS - 08/07/2023 Encounter for Prostate Cancer screening - 08/07/2023 Nocturia - 08/07/2023 Weak Urinary Stream - 08/07/2023    NON-GU PMH: Arthritis Diabetes Type 2 Hypercholesterolemia Hypertension Sickle-cell trait Sleep Apnea    FAMILY HISTORY: 2 sons - Other Myocardial Infarction - Father sickle cell trait - Son   SOCIAL HISTORY: Marital Status: Single Preferred Language: English; Race: White Current Smoking Status: Patient does not smoke anymore. Has not smoked since 07/21/2015.  <DIV'  Tobacco Use Assessment Completed:  Used Tobacco in last 30 days? </DIV'   REVIEW OF SYSTEMS:     GU Review Male:  Patient denies frequent urination, hard to postpone urination, burning/ pain with urination, get up at night to urinate, leakage of urine, stream starts and stops, trouble starting your stream, have to strain to urinate , erection problems, and penile pain.    Gastrointestinal (Upper):  Patient denies nausea, vomiting, and indigestion/ heartburn.    Gastrointestinal (Lower):  Patient denies diarrhea and constipation.    Constitutional:  Patient denies fever, night sweats, weight loss, and fatigue.    Skin:  Patient denies skin rash/ lesion and itching.    Eyes:  Patient denies blurred vision and double vision.    Ears/ Nose/ Throat:  Patient denies sore throat and sinus problems.    Hematologic/Lymphatic:  Patient denies swollen glands and easy bruising.    Cardiovascular:  Patient denies leg swelling and chest pains.    Respiratory:  Patient denies cough and shortness of breath.    Endocrine:  Patient denies excessive thirst.    Musculoskeletal:  Patient denies back pain and joint pain.    Neurological:  Patient denies headaches and  dizziness.    Psychologic:  Patient denies depression and anxiety.    VITAL SIGNS: None     MULTI-SYSTEM PHYSICAL EXAMINATION:      Constitutional: Well-nourished. No physical deformities. Normally developed. Good grooming.     Respiratory: No labored breathing, no use of accessory muscles.      Gastrointestinal: No mass, no tenderness, no rigidity, non obese abdomen.     Eyes: Normal conjunctivae. Normal eyelids.     Musculoskeletal: Normal gait and station of head and neck.            Complexity of Data:   Source Of History:  Patient  Records Review:  Previous Doctor Records, Previous Patient Records  Urodynamics Review:  Review Urodynamics Tests  X-Ray Review: Prostate Ultrasound: Reviewed Films. Discussed With Patient.      08/07/23  PSA  Total PSA 1.57 ng/mL    PROCEDURES:    Flexible Cystoscopy - 52000  Risks, benefits, and some of the potential complications of the procedure were discussed at length with the patient including infection, bleeding, voiding discomfort, urinary retention, fever, chills, sepsis, and others. All questions were answered. Informed consent was obtained. Antibiotic prophylaxis was given. Sterile technique and intraurethral analgesia were used.  Meatus:  Normal size. Normal location. Normal condition.  Urethra:  No strictures.  External Sphincter:  Normal.  Verumontanum:  Normal.  Prostate:  Obstructing. Severe hyperplasia.  Bladder Neck:  Non-obstructing.  Ureteral Orifices:  Normal location. Normal size. Normal shape. Effluxed clear urine.  Bladder:  No trabeculation. No tumors. Normal mucosa. No stones.      The lower urinary tract was carefully examined. The procedure was well-tolerated and without complications. Antibiotic instructions were given. Instructions were given to call the office immediately for bloody urine, difficulty urinating, urinary retention, painful or frequent urination, fever, chills, nausea, vomiting or other illness. The  patient stated that he understood these instructions and would comply with them.     Prostate Ultrasound - 40981  Length:5.99cm Height:4.84cm Width:5.83cm Volume:88.42ml      The transrectal ultrasound probe is introduced into the rectum, and the prostate is visualized. Ultrasonography is utilized throughout the procedure. At the conclusion of the procedure, the ultrasound probe is removed. The patient tolerates the procedure without complication. . Patient confirmed No Neulasta OnPro Device.      ASSESSMENT:     ICD-10 Details  1 GU:  BPH w/LUTS - N40.1 Chronic, Stable  2  Nocturia - R35.1 Chronic, Stable  3  Urinary Frequency - R35.0 Chronic, Stable  4  Urinary Urgency - R39.15 Chronic, Stable  5  Weak Urinary Stream - R39.12 Chronic, Stable   PLAN:   Document  Letter(s):  Created for Patient: Clinical Summary   Notes:  The patient has failed medical management for his lower urinary tract symptoms. He would like to proceed with surgical resection. I discussed aquablation of the prostate. I specifically discussed the risks including but not limited to bleeding which could require blood transfusion, infection, and injury to surrounding structures. Also discussed the possibility that the surgery would not improve symptoms though most men have a great improvement in their  symptoms. Also discussed the low likelihood but possibility of development of new symptoms such as irritative voiding symptoms or urinary incontinence. Most men will have some degree of urinary urgency and discomfort immediately following the surgery that resolves in a short amount of time. He understands that most often this is an outpatient procedure but occasionally patients require hospitalization for continuous bladder irrigation in the event of excess bleeding. He also understands the possibility of being sent home with a urethral catheter. The patient expressed understanding and is eager to proceed.      Signed  by Leila Punt, III, M.D. on 09/25/23 at 11:57 AM (EDT

## 2023-10-10 NOTE — Anesthesia Procedure Notes (Signed)
 Procedure Name: Intubation Date/Time: 10/10/2023 10:41 AM  Performed by: Manuela Sella, CRNAPre-anesthesia Checklist: Patient identified, Emergency Drugs available, Suction available and Patient being monitored Patient Re-evaluated:Patient Re-evaluated prior to induction Oxygen Delivery Method: Circle system utilized Preoxygenation: Pre-oxygenation with 100% oxygen Induction Type: IV induction Ventilation: Mask ventilation without difficulty Laryngoscope Size: Mac and 4 Grade View: Grade III Tube type: Oral Tube size: 7.5 mm Number of attempts: 1 Airway Equipment and Method: Stylet Placement Confirmation: ETT inserted through vocal cords under direct vision, positive ETCO2 and breath sounds checked- equal and bilateral Secured at: 23 cm Tube secured with: Tape Dental Injury: Teeth and Oropharynx as per pre-operative assessment

## 2023-10-10 NOTE — Discharge Instructions (Signed)
 Transurethral Resection of the Prostate (TURP) or Greenlight laser ablation of the Prostate  Care After  Refer to this sheet in the next few weeks. These discharge instructions provide you with general information on caring for yourself after you leave the hospital. Your caregiver may also give you specific instructions. Your treatment has been planned according to the most current medical practices available, but unavoidable complications sometimes occur. If you have any problems or questions after discharge, please call your caregiver.  HOME CARE INSTRUCTIONS   Medications  You may receive medicine for pain management. As your level of discomfort decreases, adjustments in your pain medicines may be made.   Take all medicines as directed.   You may be given a medicine (antibiotic) to kill germs following surgery. Finish all medicines. Let your caregiver know if you have any side effects or problems from the medicine.   If you are on aspirin, it would be best not to restart the aspirin until the blood in the urine clears Hygiene  You can take a shower after surgery.   You should not take a bath while you still have the urethral catheter. Activity  You will be encouraged to get out of bed as much as possible and increase your activity level as tolerated.   Spend the first week in and around your home. For 3 weeks, avoid the following:   Straining.   Running.   Strenuous work.   Walks longer than a few blocks.   Riding for extended periods.   Sexual relations.   Do not lift heavy objects (more than 20 pounds) for at least 1 month. When lifting, use your arms instead of your abdominal muscles.   You will be encouraged to walk as tolerated. Do not exert yourself. Increase your activity level slowly. Remember that it is important to keep moving after an operation of any type. This cuts down on the possibility of developing blood clots.   Your caregiver will tell you when you  can resume driving and light housework. Discuss this at your first office visit after discharge. Diet  No special diet is ordered after a TURP. However, if you are on a special diet for another medical problem, it should be continued.   Normal fluid intake is usually recommended.   Avoid alcohol and caffeinated drinks for 2 weeks. They irritate the bladder. Decaffeinated drinks are okay.   Avoid spicy foods.  Bladder Function  For the first 10 days, empty the bladder whenever you feel a definite desire. Do not try to hold the urine for long periods of time.   Urinating once or twice a night even after you are healed is not uncommon.   You may see some recurrence of blood in the urine after discharge from the hospital. This usually happens within 2 weeks after the procedure.If this occurs, force fluids again as you did in the hospital and reduce your activity.  Bowel Function  You may experience some constipation after surgery. This can be minimized by increasing fluids and fiber in your diet. Drink enough water and fluids to keep your urine clear or pale yellow.   A stool softener may be prescribed for use at home. Do not strain to move your bowels.   If you are requiring increased pain medicine, it is important that you take stool softeners to prevent constipation. This will help to promote proper healing by reducing the need to strain to move your bowels.  Sexual Activity  Semen movement  in the opposite direction and into the bladder (retrograde ejaculation) may occur. Since the semen passes into the bladder, cloudy urine can occur the first time you urinate after intercourse. Or, you may not have an ejaculation during erection. Ask your caregiver when you can resume sexual activity. Retrograde ejaculation and reduced semen discharge should not reduce one's pleasure of intercourse.  Postoperative Visit  Arrange the date and time of your after surgery visit with your caregiver.  Return  to Work  After your recovery is complete, you will be able to return to work and resume all activities. Your caregiver will inform you when you can return to work.    Foley Catheter Care A soft, flexible tube (Foley catheter) may have been placed in your bladder to drain urine and fluid. Follow these instructions: Taking Care of the Catheter  Keep the area where the catheter leaves your body clean.   Attach the catheter to the leg so there is no tension on the catheter.   Keep the drainage bag below the level of the bladder, but keep it OFF the floor.   Do not take long soaking baths. Your caregiver will give instructions about showering.   Wash your hands before touching ANYTHING related to the catheter or bag.   Using mild soap and warm water on a washcloth:   Clean the area closest to the catheter insertion site using a circular motion around the catheter.   Clean the catheter itself by wiping AWAY from the insertion site for several inches down the tube.   NEVER wipe upward as this could sweep bacteria up into the urethra (tube in your body that normally drains the bladder) and cause infection.   Place a small amount of sterile lubricant at the tip of the penis where the catheter is entering.  Taking Care of the Drainage Bags  Two drainage bags may be taken home: a large overnight drainage bag, and a smaller leg bag which fits underneath clothing.   It is okay to wear the overnight bag at any time, but NEVER wear the smaller leg bag at night.   Keep the drainage bag well below the level of your bladder. This prevents backflow of urine into the bladder and allows the urine to drain freely.   Anchor the tubing to your leg to prevent pulling or tension on the catheter. Use tape or a leg strap provided by the hospital.   Empty the drainage bag when it is 1/2 to 3/4 full. Wash your hands before and after touching the bag.   Periodically check the tubing for kinks to make sure  there is no pressure on the tubing which could restrict the flow of urine.  Changing the Drainage Bags  Cleanse both ends of the clean bag with alcohol before changing.   Pinch off the rubber catheter to avoid urine spillage during the disconnection.   Disconnect the dirty bag and connect the clean one.   Empty the dirty bag carefully to avoid a urine spill.   Attach the new bag to the leg with tape or a leg strap.  Cleaning the Drainage Bags  Whenever a drainage bag is disconnected, it must be cleaned quickly so it is ready for the next use.   Wash the bag in warm, soapy water.   Rinse the bag thoroughly with warm water.   Soak the bag for 30 minutes in a solution of white vinegar and water (1 cup vinegar to 1 quart warm  water).   Rinse with warm water.  SEEK MEDICAL CARE IF:   You have chills or night sweats.   You are leaking around your catheter or have problems with your catheter. It is not uncommon to have sporadic leakage around your catheter as a result of bladder spasms. If the leakage stops, there is not much need for concern. If you are uncertain, call your caregiver.   You develop side effects that you think are coming from your medicines.  SEEK IMMEDIATE MEDICAL CARE IF:   You are suddenly unable to urinate. Check to see if there are any kinks in the drainage tubing that may cause this. If you cannot find any kinks, call your caregiver immediately. This is an emergency.   You develop shortness of breath or chest pains.   Bleeding persists or clots develop in your urine.   You have a fever.   You develop pain in your back or over your lower belly (abdomen).   You develop pain or swelling in your legs.   Any problems you are having get worse rather than better.  MAKE SURE YOU:   Understand these instructions.   Will watch your condition.   Will get help right away if you are not doing well or get worse.

## 2023-10-10 NOTE — Transfer of Care (Signed)
 Immediate Anesthesia Transfer of Care Note  Patient: Peter Zamora  Procedure(s) Performed: Procedure(s): ABLATION, PROSTATE, TRANSURETHRAL, USING WATERJET (N/A)  Patient Location: PACU  Anesthesia Type:General  Level of Consciousness: Patient easily awoken, comfortable, cooperative, following commands, responds to stimulation.   Airway & Oxygen Therapy: Patient spontaneously breathing, ventilating well, oxygen via simple oxygen mask.  Post-op Assessment: Report given to PACU RN, vital signs reviewed and stable, moving all extremities.   Post vital signs: Reviewed and stable.  Complications: No apparent anesthesia complications Last Vitals:  Vitals Value Taken Time  BP 124/77 10/10/23 1207  Temp    Pulse 68 10/10/23 1211  Resp 22 10/10/23 1211  SpO2 94 % 10/10/23 1211  Vitals shown include unfiled device data.  Last Pain:  Vitals:   10/10/23 0912  TempSrc:   PainSc: 0-No pain         Complications: No notable events documented.

## 2023-10-10 NOTE — Anesthesia Postprocedure Evaluation (Signed)
 Anesthesia Post Note  Patient: ANES RIGEL  Procedure(s) Performed: ABLATION, PROSTATE, TRANSURETHRAL, USING WATERJET     Patient location during evaluation: PACU Anesthesia Type: General Level of consciousness: awake Pain management: pain level controlled Vital Signs Assessment: post-procedure vital signs reviewed and stable Respiratory status: spontaneous breathing, nonlabored ventilation and respiratory function stable Cardiovascular status: blood pressure returned to baseline and stable Postop Assessment: no apparent nausea or vomiting Anesthetic complications: no   No notable events documented.  Last Vitals:  Vitals:   10/10/23 1245 10/10/23 1300  BP: (!) 150/89 138/86  Pulse: 66 63  Resp: 19 19  Temp:    SpO2: 94% 93%    Last Pain:  Vitals:   10/10/23 1300  TempSrc:   PainSc: Asleep                 Conard Decent

## 2023-10-11 LAB — SURGICAL PATHOLOGY

## 2023-11-27 ENCOUNTER — Ambulatory Visit (INDEPENDENT_AMBULATORY_CARE_PROVIDER_SITE_OTHER): Admitting: Otolaryngology
